# Patient Record
Sex: Female | Born: 1965 | Race: White | Hispanic: No | Marital: Married | Smoking: Never smoker
Health system: Southern US, Community
[De-identification: ages and names within clinical notes are randomized; demographics above are authoritative.]

## PROBLEM LIST (undated history)

## (undated) DIAGNOSIS — K219 Gastro-esophageal reflux disease without esophagitis: Secondary | ICD-10-CM

## (undated) DIAGNOSIS — F419 Anxiety disorder, unspecified: Secondary | ICD-10-CM

## (undated) DIAGNOSIS — F32A Depression, unspecified: Secondary | ICD-10-CM

## (undated) DIAGNOSIS — R51 Headache: Secondary | ICD-10-CM

## (undated) DIAGNOSIS — N320 Bladder-neck obstruction: Secondary | ICD-10-CM

## (undated) DIAGNOSIS — F329 Major depressive disorder, single episode, unspecified: Secondary | ICD-10-CM

## (undated) HISTORY — PX: OTHER SURGICAL HISTORY: SHX169

## (undated) HISTORY — PX: TUBAL LIGATION: SHX77

## (undated) HISTORY — PX: BLADDER NECK RECONSTRUCTION: SHX1239

## (undated) HISTORY — PX: COLONOSCOPY: SHX174

---

## 2010-10-24 ENCOUNTER — Other Ambulatory Visit (HOSPITAL_COMMUNITY): Payer: Self-pay | Admitting: Urology

## 2010-10-24 DIAGNOSIS — R338 Other retention of urine: Secondary | ICD-10-CM

## 2010-10-25 ENCOUNTER — Ambulatory Visit (HOSPITAL_COMMUNITY)
Admission: RE | Admit: 2010-10-25 | Discharge: 2010-10-25 | Disposition: A | Discharge status: Home / Self Care | Payer: BC Managed Care – PPO | Source: Ambulatory Visit | Attending: Urology | Admitting: Urology

## 2010-10-25 DIAGNOSIS — R338 Other retention of urine: Secondary | ICD-10-CM | POA: Insufficient documentation

## 2010-10-25 DIAGNOSIS — N83209 Unspecified ovarian cyst, unspecified side: Secondary | ICD-10-CM | POA: Insufficient documentation

## 2010-10-25 DIAGNOSIS — R109 Unspecified abdominal pain: Secondary | ICD-10-CM | POA: Insufficient documentation

## 2010-11-08 ENCOUNTER — Other Ambulatory Visit: Payer: Self-pay | Admitting: Anesthesiology

## 2010-11-08 ENCOUNTER — Encounter (HOSPITAL_COMMUNITY): Payer: BC Managed Care – PPO

## 2010-11-08 ENCOUNTER — Other Ambulatory Visit: Payer: Self-pay | Admitting: Infectious Diseases

## 2010-11-08 LAB — BASIC METABOLIC PANEL
CO2: 26 mEq/L (ref 19–32)
Chloride: 104 mEq/L (ref 96–112)
GFR calc Af Amer: >60 mL/min (ref >60)
Potassium: 3.9 mEq/L (ref 3.5–5.1)
Sodium: 139 mEq/L (ref 135–145)

## 2010-11-08 LAB — SURGICAL PCR SCREEN: MRSA, PCR: NEGATIVE

## 2010-11-12 ENCOUNTER — Ambulatory Visit (HOSPITAL_COMMUNITY)
Admission: RE | Admit: 2010-11-12 | Discharge: 2010-11-12 | Disposition: A | Discharge status: Home / Self Care | Payer: BC Managed Care – PPO | Source: Ambulatory Visit | Attending: Urology | Admitting: Urology

## 2010-11-12 DIAGNOSIS — N35919 Unspecified urethral stricture, male, unspecified site: Secondary | ICD-10-CM | POA: Insufficient documentation

## 2010-11-21 NOTE — H&P (Signed)
  NAMECHARMAN, BLASCO               ACCOUNT NO.:  [id number]  MEDICAL RECORD NO.:  [id number]           PATIENT TYPE:  LOCATION:                                 FACILITY:  PHYSICIAN:  Ky Barban, M.D.DATE OF BIRTH:  Nov 11, 1965  DATE OF ADMISSION: DATE OF DISCHARGE:  LH                             HISTORY & PHYSICAL   CHIEF COMPLAINT:  Had difficulty to void.  HISTORY:  A 45 year old female was in acute urinary retention couple of weeks ago.  Subsequently, she underwent cystoscopy and cystometric in the office.  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  I dilated her urethra and she is voiding but not still having difficulty to void lot of hesitancy so I have recommended that we go ahead and do her dilation under anesthesia as an outpatient.  She is coming as an outpatient, will undergo cystoscopy and dilation under anesthesia.  PAST MEDICAL HISTORY:  No significant medical problem.  She does suffer from depression and takes to have also a and  PERSONAL HISTORY:  SOCIAL HISTORY:  She does not smoke or drink, and she is married.  REVIEW OF SYSTEMS:  Unremarkable.  PHYSICAL EXAMINATION:  VITAL SIGNS:  On examination, her blood pressure 130/80, temperature is normal. CENTRAL NERVOUS SYSTEM: No gross neurological deficit. HEAD, NECK, ENT:  Negative. CHEST:  Symmetrical. HEART:  Regular sinus rhythm.  No murmur. ABDOMEN:  Soft, flat.  Liver, spleen, kidneys are not palpable.  No CVA tenderness. PELVIC:  No adnexal mass or tenderness.  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  When she was here in the office on Nov 09, 2010, her residual urine was only 21 mL which is not bad so I told that we will dilate her urethra to see if it helps her symptomatically.     Ky Barban, M.D.     MIJ/MEDQ  D:  11/07/2010  T:  11/08/2010  Job:   [id number]  Electronically Signed by Alleen Borne M.D. on 11/21/2010 03:09:27 PM

## 2010-11-21 NOTE — Op Note (Signed)
  [name], [name]               ACCOUNT NO.:  [id number]  MEDICAL RECORD NO.:  [id number]           PATIENT TYPE:  O  LOCATION:  [facility]YP                          FACILITY:  [facility]  PHYSICIAN:  Ky Barban, M.D.DATE OF BIRTH:  08-Jun-1966  DATE OF PROCEDURE:  11/12/2010 DATE OF DISCHARGE:                              OPERATIVE REPORT   PREOPERATIVE DIAGNOSIS:  Difficulty to void.  POSTOPERATIVE DIAGNOSIS:  Urethral stenosis.  PROCEDURE:  Cystoscopy with dilation.  SURGEON:  Ky Barban, MD  ANESTHESIA:  MAC.  PROCEDURE IN DETAIL:  The patient under IV MAC in lithotomy position after usual prep and drape, a #25 cystoscope was introduced into the bladder.  It was inspected thoroughly.  No tumor stone, foreign bodies, or inflammation.  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  Urethra also looks of normal.  It calibrates to 25-French.  Cystoscope was removed.  Urethra was dilated gradually to 32-French.  Then at the end, I introduced two sounds together, size 22 and 18 to dilate her further with no complications. Cystoscope was removed.  Bladder was emptied.  The patient left the operating room in satisfactory condition.  I did a bimanual exam and also pelvic exam and it was unremarkable.  The patient left the operating room in satisfactory condition.     Ky Barban, M.D.     MIJ/MEDQ  D:  11/12/2010  T:  11/12/2010  Job:  [id number]  Electronically Signed by Alleen Borne M.D. on 11/21/2010 03:09:30 PM

## 2011-01-31 ENCOUNTER — Encounter (HOSPITAL_COMMUNITY): Payer: Self-pay

## 2011-01-31 ENCOUNTER — Encounter (HOSPITAL_COMMUNITY)
Admission: RE | Admit: 2011-01-31 | Discharge: 2011-01-31 | Disposition: A | Discharge status: Home / Self Care | Payer: BC Managed Care – PPO | Source: Ambulatory Visit | Attending: Obstetrics and Gynecology | Admitting: Obstetrics and Gynecology

## 2011-01-31 HISTORY — DX: Major depressive disorder, single episode, unspecified: F32.9

## 2011-01-31 HISTORY — DX: Gastro-esophageal reflux disease without esophagitis: K21.9

## 2011-01-31 HISTORY — DX: Depression, unspecified: F32.A

## 2011-01-31 HISTORY — DX: Headache: R51

## 2011-01-31 HISTORY — DX: Bladder-neck obstruction: N32.0

## 2011-01-31 HISTORY — DX: Anxiety disorder, unspecified: F41.9

## 2011-01-31 LAB — BASIC METABOLIC PANEL
CO2: 28 mEq/L (ref 19–32)
Chloride: 99 mEq/L (ref 96–112)
GFR calc non Af Amer: >60 mL/min (ref >60)
Glucose, Bld: 92 mg/dL (ref 70–99)
Potassium: 4.1 mEq/L (ref 3.5–5.1)
Sodium: 136 mEq/L (ref 135–145)

## 2011-01-31 LAB — URINALYSIS, ROUTINE W REFLEX MICROSCOPIC
Glucose, UA: NEGATIVE mg/dL
Ketones, ur: 15 mg/dL — AB
pH: 6 (ref 5.0–8.0)

## 2011-01-31 LAB — CBC
HCT: 39.2 % (ref 36.0–46.0)
Hemoglobin: 12.7 g/dL (ref 12.0–15.0)
MCH: 25.7 pg — ABNORMAL LOW (ref 26.0–34.0)
MCV: 79.4 fL (ref 78.0–100.0)
RBC: 4.94 MIL/uL (ref 3.87–5.11)

## 2011-01-31 LAB — URINE MICROSCOPIC-ADD ON

## 2011-01-31 NOTE — Patient Instructions (Addendum)
20 [name]  01/31/2011   Your procedure is scheduled on:  02/11/11  Report to [facility] at 1030 AM.desk phone- [phone]  Call this number if you have problems the morning of surgery: [phone]   Remember:   Do not eat food:After Midnight.  Do not drink clear liquids: 4 Hours before arrival.until 8am  Take these medicines the morning of surgery with A SIP OF WATER: Dexilant   Do not wear jewelry, make-up or nail polish.  Do not wear lotions, powders, or perfumes. You may wear deodorant.  Do not shave 48 hours prior to surgery.  Do not bring valuables to the hospital.  Contacts, dentures or bridgework may not be worn into surgery.  Leave suitcase in the car. After surgery it may be brought to your room.  For patients admitted to the hospital, checkout time is 11:00 AM the day of discharge.   Patients discharged the day of surgery will not be allowed to drive home.  Name and phone number of your driver: Fayrene Fearing- [phone]  Special Instructions: CHG Shower Use Special Wash: 1/2 bottle night before surgery and 1/2 bottle morning of surgery.   Please read over the following fact sheets that you were given: Care and Recovery After Surgery

## 2011-02-10 MED ORDER — CEFOTETAN DISODIUM 2 G IJ SOLR
2.0000 g | INTRAMUSCULAR | Status: AC
  Administered 2011-02-11: 2 g via INTRAVENOUS
  Filled 2011-02-10: qty 2

## 2011-02-11 ENCOUNTER — Encounter (HOSPITAL_COMMUNITY): Payer: Self-pay | Admitting: *Deleted

## 2011-02-11 ENCOUNTER — Encounter (HOSPITAL_COMMUNITY): Payer: Self-pay

## 2011-02-11 ENCOUNTER — Encounter (HOSPITAL_COMMUNITY)
Admission: RE | Disposition: A | Discharge status: Home / Self Care | Payer: Self-pay | Source: Ambulatory Visit | Attending: Obstetrics and Gynecology

## 2011-02-11 ENCOUNTER — Other Ambulatory Visit: Payer: Self-pay | Admitting: Obstetrics and Gynecology

## 2011-02-11 ENCOUNTER — Ambulatory Visit (HOSPITAL_COMMUNITY)
Admission: RE | Admit: 2011-02-11 | Discharge: 2011-02-13 | Disposition: A | Discharge status: Home / Self Care | Payer: BC Managed Care – PPO | Source: Ambulatory Visit | Attending: Obstetrics and Gynecology | Admitting: Obstetrics and Gynecology

## 2011-02-11 ENCOUNTER — Ambulatory Visit (HOSPITAL_COMMUNITY): Payer: BC Managed Care – PPO

## 2011-02-11 DIAGNOSIS — N8000 Endometriosis of the uterus, unspecified: Secondary | ICD-10-CM | POA: Insufficient documentation

## 2011-02-11 DIAGNOSIS — Z01812 Encounter for preprocedural laboratory examination: Secondary | ICD-10-CM | POA: Insufficient documentation

## 2011-02-11 DIAGNOSIS — Z01818 Encounter for other preprocedural examination: Secondary | ICD-10-CM | POA: Insufficient documentation

## 2011-02-11 DIAGNOSIS — Z90710 Acquired absence of both cervix and uterus: Secondary | ICD-10-CM

## 2011-02-11 DIAGNOSIS — N393 Stress incontinence (female) (male): Secondary | ICD-10-CM | POA: Insufficient documentation

## 2011-02-11 DIAGNOSIS — N920 Excessive and frequent menstruation with regular cycle: Secondary | ICD-10-CM | POA: Insufficient documentation

## 2011-02-11 HISTORY — PX: BLADDER SUSPENSION: SHX72

## 2011-02-11 HISTORY — PX: CYSTOSCOPY: SHX5120

## 2011-02-11 HISTORY — PX: VAGINAL HYSTERECTOMY: SHX2639

## 2011-02-11 LAB — PREGNANCY, URINE: Preg Test, Ur: NEGATIVE

## 2011-02-11 SURGERY — HYSTERECTOMY, VAGINAL
Anesthesia: General | Site: Bladder

## 2011-02-11 MED ORDER — ONDANSETRON HCL 4 MG/2ML IJ SOLN
INTRAMUSCULAR | Status: AC
  Filled 2011-02-11: qty 2

## 2011-02-11 MED ORDER — GLYCOPYRROLATE 0.2 MG/ML IJ SOLN
INTRAMUSCULAR | Status: AC
  Filled 2011-02-11: qty 3

## 2011-02-11 MED ORDER — SODIUM CHLORIDE 0.9 % IJ SOLN: 9.0000 mL | INTRAMUSCULAR | Status: DC | PRN

## 2011-02-11 MED ORDER — DEXAMETHASONE SODIUM PHOSPHATE 4 MG/ML IJ SOLN
INTRAMUSCULAR | Status: DC | PRN
  Administered 2011-02-11: 10 mg via INTRAVENOUS

## 2011-02-11 MED ORDER — ONDANSETRON HCL 4 MG/2ML IJ SOLN
INTRAMUSCULAR | Status: DC | PRN
  Administered 2011-02-11: 4 mg via INTRAVENOUS

## 2011-02-11 MED ORDER — DEXAMETHASONE SODIUM PHOSPHATE 10 MG/ML IJ SOLN
INTRAMUSCULAR | Status: AC
  Filled 2011-02-11: qty 1

## 2011-02-11 MED ORDER — NEOSTIGMINE METHYLSULFATE 1 MG/ML IJ SOLN
INTRAMUSCULAR | Status: DC | PRN
  Administered 2011-02-11: 2.5 mg via INTRAVENOUS

## 2011-02-11 MED ORDER — LIDOCAINE HCL (CARDIAC) 20 MG/ML IV SOLN
INTRAVENOUS | Status: AC
  Filled 2011-02-11: qty 5

## 2011-02-11 MED ORDER — INDIGOTINDISULFONATE SODIUM 8 MG/ML IJ SOLN
INTRAMUSCULAR | Status: DC | PRN
  Administered 2011-02-11: 5 mL via INTRAVENOUS

## 2011-02-11 MED ORDER — FENTANYL CITRATE 0.05 MG/ML IJ SOLN
INTRAMUSCULAR | Status: AC
  Filled 2011-02-11: qty 5

## 2011-02-11 MED ORDER — MENTHOL 3 MG MT LOZG: 1.0000 | LOZENGE | OROMUCOSAL | Status: DC | PRN

## 2011-02-11 MED ORDER — FENTANYL CITRATE 0.05 MG/ML IJ SOLN
25.0000 ug | INTRAMUSCULAR | Status: DC | PRN
  Administered 2011-02-11: 25 ug via INTRAVENOUS

## 2011-02-11 MED ORDER — ONDANSETRON HCL 4 MG/2ML IJ SOLN
4.0000 mg | Freq: Four times a day (QID) | INTRAMUSCULAR | Status: DC | PRN
  Administered 2011-02-12: 4 mg via INTRAVENOUS
  Filled 2011-02-11: qty 2

## 2011-02-11 MED ORDER — LACTATED RINGERS IV SOLN
INTRAVENOUS | Status: DC
  Administered 2011-02-11 (×2): via INTRAVENOUS
  Administered 2011-02-11: 1000 mL via INTRAVENOUS

## 2011-02-11 MED ORDER — MORPHINE SULFATE (PF) 1 MG/ML IV SOLN
INTRAVENOUS | Status: DC
  Administered 2011-02-11 – 2011-02-12 (×3): 25 mg via INTRAVENOUS
  Administered 2011-02-12: 39 mg via INTRAVENOUS
  Administered 2011-02-12: 10 mg via INTRAVENOUS
  Filled 2011-02-11 (×4): qty 25

## 2011-02-11 MED ORDER — HYDROMORPHONE HCL 1 MG/ML IJ SOLN
INTRAMUSCULAR | Status: DC | PRN
  Administered 2011-02-11: 1 mg via INTRAVENOUS

## 2011-02-11 MED ORDER — PROPOFOL 10 MG/ML IV EMUL
INTRAVENOUS | Status: AC
  Filled 2011-02-11: qty 20

## 2011-02-11 MED ORDER — LIDOCAINE HCL (CARDIAC) 20 MG/ML IV SOLN
INTRAVENOUS | Status: DC | PRN
  Administered 2011-02-11: 20 mg via INTRAVENOUS

## 2011-02-11 MED ORDER — BUPROPION HCL ER (SR) 150 MG PO TB12
150.0000 mg | ORAL_TABLET | Freq: Two times a day (BID) | ORAL | Status: DC
  Administered 2011-02-11 – 2011-02-13 (×4): 150 mg via ORAL
  Filled 2011-02-11 (×6): qty 1

## 2011-02-11 MED ORDER — IBUPROFEN 600 MG PO TABS
600.0000 mg | ORAL_TABLET | Freq: Four times a day (QID) | ORAL | Status: DC | PRN
  Administered 2011-02-13: 600 mg via ORAL
  Filled 2011-02-11: qty 1

## 2011-02-11 MED ORDER — STERILE WATER FOR IRRIGATION IR SOLN
Status: DC | PRN
  Administered 2011-02-11: 1000 mL

## 2011-02-11 MED ORDER — MEPERIDINE HCL 25 MG/ML IJ SOLN: 6.2500 mg | INTRAMUSCULAR | Status: DC | PRN

## 2011-02-11 MED ORDER — PANTOPRAZOLE SODIUM 40 MG PO TBEC
40.0000 mg | DELAYED_RELEASE_TABLET | Freq: Every day | ORAL | Status: DC
  Administered 2011-02-12: 40 mg via ORAL
  Filled 2011-02-11 (×4): qty 1

## 2011-02-11 MED ORDER — FENTANYL CITRATE 0.05 MG/ML IJ SOLN
INTRAMUSCULAR | Status: DC | PRN
  Administered 2011-02-11 (×3): 50 ug via INTRAVENOUS
  Administered 2011-02-11: 100 ug via INTRAVENOUS
  Administered 2011-02-11 (×3): 50 ug via INTRAVENOUS

## 2011-02-11 MED ORDER — SUCCINYLCHOLINE CHLORIDE 20 MG/ML IJ SOLN
INTRAMUSCULAR | Status: DC | PRN
  Administered 2011-02-11: 60 mg via INTRAVENOUS

## 2011-02-11 MED ORDER — KETOROLAC TROMETHAMINE 30 MG/ML IJ SOLN
INTRAMUSCULAR | Status: DC | PRN
  Administered 2011-02-11: 30 mg via INTRAVENOUS

## 2011-02-11 MED ORDER — ROCURONIUM BROMIDE 50 MG/5ML IV SOLN
INTRAVENOUS | Status: AC
  Filled 2011-02-11: qty 1

## 2011-02-11 MED ORDER — DIPHENHYDRAMINE HCL 50 MG/ML IJ SOLN: 12.5000 mg | Freq: Four times a day (QID) | INTRAMUSCULAR | Status: DC | PRN

## 2011-02-11 MED ORDER — NEOSTIGMINE METHYLSULFATE 1 MG/ML IJ SOLN
INTRAMUSCULAR | Status: AC
  Filled 2011-02-11: qty 10

## 2011-02-11 MED ORDER — FENTANYL CITRATE 0.05 MG/ML IJ SOLN
INTRAMUSCULAR | Status: AC
  Administered 2011-02-11: 25 ug via INTRAVENOUS
  Filled 2011-02-11: qty 2

## 2011-02-11 MED ORDER — METOCLOPRAMIDE HCL 5 MG/ML IJ SOLN: 10.0000 mg | Freq: Once | INTRAMUSCULAR | Status: DC | PRN

## 2011-02-11 MED ORDER — SIMETHICONE 80 MG PO CHEW
80.0000 mg | CHEWABLE_TABLET | Freq: Four times a day (QID) | ORAL | Status: DC | PRN
  Administered 2011-02-12: 80 mg via ORAL

## 2011-02-11 MED ORDER — MIDAZOLAM HCL 5 MG/5ML IJ SOLN
INTRAMUSCULAR | Status: DC | PRN
  Administered 2011-02-11: 2 mg via INTRAVENOUS

## 2011-02-11 MED ORDER — DIPHENHYDRAMINE HCL 12.5 MG/5ML PO ELIX: 12.5000 mg | ORAL_SOLUTION | Freq: Four times a day (QID) | ORAL | Status: DC | PRN

## 2011-02-11 MED ORDER — PROPOFOL 10 MG/ML IV EMUL
INTRAVENOUS | Status: DC | PRN
  Administered 2011-02-11: 200 mg via INTRAVENOUS

## 2011-02-11 MED ORDER — DOCUSATE SODIUM 100 MG PO CAPS
100.0000 mg | ORAL_CAPSULE | Freq: Every day | ORAL | Status: DC
  Administered 2011-02-12 – 2011-02-13 (×2): 100 mg via ORAL
  Filled 2011-02-11 (×2): qty 1

## 2011-02-11 MED ORDER — KETOROLAC TROMETHAMINE 30 MG/ML IJ SOLN
INTRAMUSCULAR | Status: AC
  Filled 2011-02-11: qty 1

## 2011-02-11 MED ORDER — OXYCODONE-ACETAMINOPHEN 5-325 MG PO TABS
1.0000 | ORAL_TABLET | ORAL | Status: DC | PRN
  Administered 2011-02-12: 2 via ORAL
  Administered 2011-02-12: 1 via ORAL
  Administered 2011-02-12: 2 via ORAL
  Administered 2011-02-12: 1 via ORAL
  Administered 2011-02-13: 2 via ORAL
  Administered 2011-02-13: 1 via ORAL
  Administered 2011-02-13: 2 via ORAL
  Administered 2011-02-13: 1 via ORAL
  Filled 2011-02-11 (×2): qty 2
  Filled 2011-02-11 (×3): qty 1
  Filled 2011-02-11: qty 2
  Filled 2011-02-11: qty 1
  Filled 2011-02-11: qty 2

## 2011-02-11 MED ORDER — ROCURONIUM BROMIDE 100 MG/10ML IV SOLN
INTRAVENOUS | Status: DC | PRN
  Administered 2011-02-11: 40 mg via INTRAVENOUS
  Administered 2011-02-11: 10 mg via INTRAVENOUS

## 2011-02-11 MED ORDER — MIDAZOLAM HCL 2 MG/2ML IJ SOLN
INTRAMUSCULAR | Status: AC
  Filled 2011-02-11: qty 2

## 2011-02-11 MED ORDER — NALOXONE HCL 0.4 MG/ML IJ SOLN: 0.4000 mg | INTRAMUSCULAR | Status: DC | PRN

## 2011-02-11 MED ORDER — TEMAZEPAM 15 MG PO CAPS: 15.0000 mg | ORAL_CAPSULE | Freq: Every evening | ORAL | Status: DC | PRN

## 2011-02-11 MED ORDER — HYDROMORPHONE HCL 1 MG/ML IJ SOLN
INTRAMUSCULAR | Status: AC
  Filled 2011-02-11: qty 1

## 2011-02-11 MED ORDER — GLYCOPYRROLATE 0.2 MG/ML IJ SOLN
INTRAMUSCULAR | Status: DC | PRN
  Administered 2011-02-11: 0.2 mg via INTRAVENOUS
  Administered 2011-02-11: .5 mg via INTRAVENOUS

## 2011-02-11 MED ORDER — LIDOCAINE-EPINEPHRINE 1 %-1:100000 IJ SOLN
INTRAMUSCULAR | Status: DC | PRN
  Administered 2011-02-11: 10 mL via INTRADERMAL

## 2011-02-11 MED ORDER — SUCCINYLCHOLINE CHLORIDE 20 MG/ML IJ SOLN
INTRAMUSCULAR | Status: AC
  Filled 2011-02-11: qty 1

## 2011-02-11 MED ORDER — LACTATED RINGERS IV SOLN
INTRAVENOUS | Status: DC
  Administered 2011-02-11 – 2011-02-12 (×4): via INTRAVENOUS

## 2011-02-11 SURGICAL SUPPLY — 42 items
BLADE SURG 11 STRL SS (BLADE) ×3 IMPLANT
CANISTER SUCTION 2500CC (MISCELLANEOUS) ×3 IMPLANT
CATH FOLEY 2WAY SLVR  5CC 18FR (CATHETERS) ×1
CATH FOLEY 2WAY SLVR 5CC 18FR (CATHETERS) ×2 IMPLANT
CLOTH BEACON ORANGE TIMEOUT ST (SAFETY) ×3 IMPLANT
CONT PATH 16OZ SNAP LID 3702 (MISCELLANEOUS) IMPLANT
CONTAINER PREFILL 10% NBF 60ML (FORM) IMPLANT
DECANTER SPIKE VIAL GLASS SM (MISCELLANEOUS) ×3 IMPLANT
DERMABOND ADVANCED (GAUZE/BANDAGES/DRESSINGS) ×3 IMPLANT
DRAPE CAMERA CLOSED 9X96 (DRAPES) ×3 IMPLANT
DRAPE HYSTEROSCOPY (DRAPE) ×3 IMPLANT
DRAPE UTILITY XL STRL (DRAPES) ×3 IMPLANT
GAUZE PACKING 2X5 YD STERILE (GAUZE/BANDAGES/DRESSINGS) ×3 IMPLANT
GAUZE PACKING IODOFORM 2 (PACKING) ×3 IMPLANT
GLOVE BIO SURGEON STRL SZ 6.5 (GLOVE) ×6 IMPLANT
GLOVE BIO SURGEON STRL SZ8 (GLOVE) IMPLANT
GLOVE BIOGEL PI IND STRL 6.5 (GLOVE) ×2 IMPLANT
GLOVE BIOGEL PI INDICATOR 6.5 (GLOVE) ×1
GOWN PREVENTION PLUS LG XLONG (DISPOSABLE) ×12 IMPLANT
NEEDLE HYPO 22GX1.5 SAFETY (NEEDLE) ×3 IMPLANT
NEEDLE SPNL 22GX3.5 QUINCKE BK (NEEDLE) IMPLANT
NS IRRIG 1000ML POUR BTL (IV SOLUTION) ×3 IMPLANT
PACK VAGINAL WOMENS (CUSTOM PROCEDURE TRAY) ×3 IMPLANT
PLUG CATH AND CAP STER (CATHETERS) ×3 IMPLANT
SET CYSTO W/LG BORE CLAMP LF (SET/KITS/TRAYS/PACK) ×3 IMPLANT
SLING TVT EXACT (Sling) ×3 IMPLANT
SPONGE SURGIFOAM ABS GEL 12-7 (HEMOSTASIS) ×3 IMPLANT
SURGIFLO TRUKIT (HEMOSTASIS) IMPLANT
SUT VIC AB 0 CT1 18XCR BRD8 (SUTURE) ×6 IMPLANT
SUT VIC AB 0 CT1 27 (SUTURE) ×4
SUT VIC AB 0 CT1 27XBRD ANBCTR (SUTURE) ×8 IMPLANT
SUT VIC AB 0 CT1 8-18 (SUTURE) ×3
SUT VIC AB 2-0 CT2 27 (SUTURE) IMPLANT
SUT VIC AB 2-0 SH 27 (SUTURE) ×2
SUT VIC AB 2-0 SH 27XBRD (SUTURE) ×4 IMPLANT
SUT VIC AB 2-0 UR5 27 (SUTURE) IMPLANT
SUT VICRYL 0 TIES 12 18 (SUTURE) ×3 IMPLANT
SYRINGE 10CC LL (SYRINGE) ×3 IMPLANT
TOWEL OR 17X24 6PK STRL BLUE (TOWEL DISPOSABLE) ×6 IMPLANT
TRAY FOLEY CATH 16FR SILVER (SET/KITS/TRAYS/PACK) ×3 IMPLANT
TUBING CONNECTING 10 (TUBING) ×3 IMPLANT
WATER STERILE IRR 1000ML POUR (IV SOLUTION) ×3 IMPLANT

## 2011-02-11 NOTE — Anesthesia Procedure Notes (Addendum)
Procedure Name: Intubation Date/Time: 02/11/2011 1:36 PM Performed by: Lincoln Brigham Pre-anesthesia Checklist: Patient identified, Patient being monitored, Emergency Drugs available, Timeout performed and Suction available Patient Re-evaluated:Patient Re-evaluated prior to inductionOxygen Delivery Method: Circle System Utilized Preoxygenation: Pre-oxygenation with 100% oxygen Intubation Type: IV induction and Rapid sequence Laryngoscope Size: Miller and 2 Grade View: Grade II Tube type: Oral Tube size: 7.0 mm Number of attempts: 1 Airway Equipment and Method: stylet Placement Confirmation: ETT inserted through vocal cords under direct vision,  positive ETCO2 and breath sounds checked- equal and bilateral Secured at: 22 cm Tube secured with: Tape Dental Injury: Teeth and Oropharynx as per pre-operative assessment

## 2011-02-11 NOTE — Op Note (Signed)
[name], [name]               ACCOUNT NO.:  [id number]  MEDICAL RECORD NO.:  [id number]  LOCATION:  9304                          FACILITY:  [facility]  PHYSICIAN:  Randye Lobo, M.D.   DATE OF BIRTH:  July 01, 1965  DATE OF PROCEDURE:  02/11/2011 DATE OF DISCHARGE:                              OPERATIVE REPORT   PREOPERATIVE DIAGNOSES: 1. Menometrorrhagia. 2. Genuine stress incontinence.  POSTOPERATIVE DIAGNOSES: 1. Menometrorrhagia. 2. Genuine stress incontinence.  PROCEDURE:  Total vaginal hysterectomy, TVT Exact mid urethral sling, and cystoscopy.  SURGEON:  Conley Simmonds, MD  ASSISTANT:  Lodema Hong, MD  ANESTHESIA:  General endotracheal, local with 1% lidocaine with epinephrine 1:100,000.  IV FLUIDS:  2500 mL Ringer lactate.  ESTIMATED BLOOD LOSS:  100 mL.  URINE OUTPUT:  250 mL.  COMPLICATIONS:  None.  INDICATIONS FOR PROCEDURE:  The patient is a 45 year old gravida 4, para 2-0-0-2 Caucasian female, status post bilateral tubal ligation who presented with heavy irregular menstrual bleeding and urinary incontinence with stressful maneuvers.  Office ultrasound documented an endometrial stripe of 23 mm and was otherwise unremarkable.  Endometrial biopsy was benign.  The patient has had urinary incontinence which temporarily was improved with the use of Cymbalta for treatment of depression.  However, the patient actually developed urinary retention and therefore underwent 2 urethral dilations in 2012.  She reports that the urinary retention has resolved since stopping the Cymbalta and now her urinary incontinence with coughing and sneezing has worsened.  She has had urodynamic studies performed at an outside institution.  The patient desires hysterectomy and surgical treatment for her urinary incontinence and plan is therefore made to proceed with a total vaginal hysterectomy with TVT Exact mid urethral sling and cystoscopy after risks, benefits, and alternatives are  reviewed.  FINDINGS:  Exam under anesthesia revealed minimal vaginal descensus. The anterior and posterior walls similarly demonstrated minimal descensus.  The uterus appeared to be approximately 6 weeks' size, and the cervix was patulous.  The fallopian tubes and ovaries could not be visualized, but they palpated to be normal.  Cystoscopy during placement of the sling documented the absence of a foreign body in the bladder and the urethra.  The bladder was visualized throughout 360 degrees including the bladder dome and trigone.  The ureters were patent bilaterally after the injection of indigo carmine dye IV.  SPECIMEN:  The uterus and cervix were sent to Pathology.  PROCEDURE:  The patient was reidentified in the preoperative hold area. She received cefotetan for IV antibiotic prophylaxis, and she received TED hose and PAS stockings for DVT prophylaxis.  In the operating room, general endotracheal anesthesia was induced, and the patient was placed in the dorsal lithotomy position.  The lower abdomen, vagina, and perineum were then sterilely prepped and draped.  A Foley catheter was sterilely placed inside the bladder.  A weighted speculum was placed inside the vagina.  Tenaculums were placed on the anterior and posterior cervical lips.  The cervix was then injected circumferentially with 1% lidocaine with epinephrine 1:100,000. The cervix was then circumscribed with a scalpel.  The posterior cul-de- sac was entered sharply with a Mayo scissors.  Digital exam confirmed proper entry  into the posterior cul-de-sac.  A long weighted speculum was placed inside the posterior cul-de-sac. Each of the uterosacral ligaments were then clamped, sharply divided, and suture ligated with transfixing sutures of 0 Vicryl.  The bladder was dissected off of the cervix anteriorly using sharp dissection.  Each of the bladder pillars were then clamped, sharply divided, and suture ligated with 0  Vicryl.  The anterior cul-de-sac was entered sharply at this time and again digital examination confirmed proper entry into this location.  A Deaver was then placed in the anterior cul-de-sac.  The cardinal ligaments were then clamped, sharply divided, and suture ligated with 0 Vicryl bilaterally.  The inferior aspects of the broad ligaments were then clamped, sharply divided, again suture-ligated with 0 Vicryl bilaterally.  Eventually, the pedicles reached the round ligaments which were clamped, sharply divided, and suture ligated with 0 Vicryl.  The proximal fallopian tubes and utero-ovarian ligaments were then clamped, sharply divided, and the specimen was freed and sent to Pathology.  Each of these upper pedicles were tied with a free tie of 0 Vicryl followed by a suture ligature of the same.  These pedicles were then held.  All the pedicles were examined.  There was some bleeding noted just below the utero-ovarian ligament and the fallopian tube on the patient's right-hand side.  This was grasped with a right angle and was suture ligated with a transfixing suture of 0 Vicryl to produce good hemostasis.  On each side just above the uterosacral ligaments.  The pedicle was bleeding slightly and two figure-of-eight sutures of 0 Vicryl were placed again which created good hemostasis.  The posterior vaginal mucosa was then sutured with a running locked suture of 0 Vicryl.  The entire vaginal cuff was closed at this time by placing a running locked suture of 0 Vicryl.  Hemostasis was good.  The sling was performed next.  Allis clamps were placed along the anterior vaginal wall in the midline beginning 1 cm below the urethral meatus.  These were placed over a distance of 4 cm.  The mucosa was injected locally with 1% lidocaine with epinephrine 1:100,000.  The mucosa was then incised vertically in the midline with a scalpel.  A combination of sharp and blunt dissection were used to  dissect the subvaginal tissue and bladder off of the overlying vaginal mucosa.  Some of the fascia over the bladder just lateral to the urethra and the patient's left-hand side appeared denuded and a figure-of-eight suture of 2-0 Vicryl was used to reapproximate the fascia.  A 1-cm suprapubic incisions were then created 2 cm to the right and left of the midline.  The TVT Exact was placed in a bottom up fashion.  The Foley catheter was removed and the urethral Foley.  Obturator was then placed in the urethra and deflected properly.  The sling needle passer was placed first through the endopelvic fascia on the patient's right-hand side lateral to the level of the mid urethra.  It was brought through the space of Retzius and then out through the right suprapubic incision. The urethra was then deflected in the other direction and the other remaining arm of the sling was brought up through the left suprapubic incision.  Cystoscopy was performed at this time and the findings are as noted above.  The sling was brought through the suprapubic incisions, and a Kelly clamp was placed between the sling and the urethra as the plastic sheaths were removed.  The excess sling was trimmed suprapubically.  There was a bleeding vein at the bed of the dissection on the patient's right-hand side vaginally and this responded to a figure-of-eight suture of 2-0 Vicryl.  Excess vaginal sling was trimmed and Gelfoam was then placed near the exit sites of the sling vaginally, bilaterally.  The anterior vaginal mucosa was then closed with a running locked suture of 2-0 Vicryl.  A vaginal packing with Estrace cream was placed inside the vagina.  The suprapubic incisions were closed with Dermabond.  The Foley catheter was left to gravity drainage.  This concluded the patient's procedure.  There were no complications. All needle, instrument, sponge counts were correct.     Randye Lobo,  M.D.     BES/MEDQ  D:  02/11/2011  T:  02/11/2011  Job:  [id number]

## 2011-02-11 NOTE — Brief Op Note (Signed)
02/11/2011  4:11 PM  PATIENT:  [name]  45 y.o. female  PRE-OPERATIVE DIAGNOSIS:  menometrorrhagia;stress urinary incontinence  POST-OPERATIVE DIAGNOSIS:  menometrorrhagia;stress urinary incontinence  PROCEDURE:  Procedure(s): HYSTERECTOMY VAGINAL TRANSVAGINAL TAPE (TVT) PROCEDURE CYSTOSCOPY  SURGEON:  Surgeon(s): Kollyn Lingafelter A Silva W Scott Bowie  PHYSICIAN ASSISTANT:   ASSISTANTS: Luvenia Redden  ANESTHESIA:   general  ESTIMATED BLOOD LOSS: * No blood loss amount entered *   BLOOD ADMINISTERED:none  DRAINS: none and Urinary Catheter (Foley)   LOCAL MEDICATIONS USED:  LIDOCAINE   SPECIMEN:  Source of Specimen:  uterus and cervix  DISPOSITION OF SPECIMEN:  PATHOLOGY  COUNTS:  YES  TOURNIQUET:  * No tourniquets in log *  DICTATION #: PLAN OF CARE: Admit post op.  PATIENT DISPOSITION:  PACU - hemodynamically stable.   Delay start of Pharmacological VTE agent (>24hrs) due to surgical blood loss or risk of bleeding:  no

## 2011-02-11 NOTE — H&P (Signed)
NAMEANGELINA, VENARD               ACCOUNT NO.:  [id number]  MEDICAL RECORD NO.:  [id number]  LOCATION:                                 FACILITY:  PHYSICIAN:  Randye Lobo, M.D.   DATE OF BIRTH:  1966-04-13  DATE OF ADMISSION: DATE OF DISCHARGE:                             HISTORY & PHYSICAL   CHIEF COMPLAINT:  Heavy and irregular menstrual bleeding and urinary incontinence.  HISTORY OF PRESENT ILLNESS:  The patient is a 45 year old gravida 4, para 2-0-2-2 Caucasian female, status post bilateral tubal ligation, who presents with heavy and irregular menstrual bleeding and urinary incontinence.  The patient reports bleeding in between her cycles and also heavy flow during her menses, which causes her to bleed through pads.  The patient has recently stopped her birth control pills secondary to weight gain.  An office ultrasound on January 02, 2011, documented a uterus with an endometrial stripe of 23.18 cm.  There were no fibroids.  The ovaries were normal.  An endometrial biopsy on January 02, 2011, documented benign secretory endometrium.  The patient reports urinary incontinence with coughing and sneezing. She reports recent treatment with Cymbalta for depression.  This was followed by a weight gain and then urinary retention.  The patient has been status post urethral dilation x2 in 2012 secondary to urinary retention.  This was performed by Dr. Jerre Simon at [facility].  The patient carries a diagnosis of some urethral stenosis. She has had cystoscopy documenting a normal bladder.  She had a urinary tract ultrasound on Oct 25, 2010, which showed unremarkable kidneys and a normal bladder.  Cystometric studies were performed by Dr. Jerre Simon and the office, and the report is unavailable.  The patient reports that since discontinuing the Cymbalta, her voiding pattern is now normal. She reports that her urinary incontinence has worsened.  The patient is now requesting  hysterectomy and surgical treatment for urinary incontinence.  She declines future childbearing.  PAST GYNECOLOGIC HISTORY:  Status post spontaneous vaginal delivery x2. Status post missed abortion x2.  Status post bilateral tubal ligation in 1995.  Last Pap smear December 19, 2010, was within normal limits.  Last mammogram December 20, 2010, and was within normal limits.  PAST MEDICAL HISTORY: 1. Gastroesophageal reflux disease. 2. Depression. 3. Urinary attention.  PAST SURGICAL HISTORY:  Status post urethral dilation x3.  The first urethral dilation was 11 years ago due to recurrent urinary tract infections.  She is status post urethral dilation x2 in 2012 for urinary retention.  MEDICATIONS:  Wellbutrin, Paxil, and Dexilant.  ALLERGIES:  No known drug allergies.  SOCIAL HISTORY:  The patient has been married for 23 years.  She works as an Print production planner for [employer].  She denies the use of tobacco, alcohol, or illicit drugs.  FAMILY HISTORY:  Negative for breast, ovarian, uterine, or colon cancer.  Positive for renal failure in the patient's mother.  REVIEW OF SYSTEMS:  The patient reports an increase in lower extremity edema since stopping Yaz birth control pills.  PHYSICAL EXAMINATION:  VITAL SIGNS:  Height 5 feet 3 inches, weight 206 pounds, blood pressure 130/80. LUNGS:  Clear to auscultation bilaterally.  HEART:  S1 and S2 with a regular rate and rhythm. ABDOMEN:  Soft and nontender without evidence of hepatosplenomegaly or organomegaly. PELVIC:  Normal external genitalia and urethra.  The cervix and vagina demonstrate no lesions.  There is minimal uterine prolapse.  The anterior and posterior vaginal walls appear to be well supported.  The uterus is small and nontender.  There is no evidence of any adnexal masses nor tenderness.  IMPRESSION:  The patient is a 45 year old gravida 4, para 2-0-2-2 Caucasian female, status post bilateral tubal ligation  with menometrorrhagia and genuine stress incontinence.  PLAN:  The patient will undergo a total vaginal hysterectomy with a TVT and midurethral sling and cystoscopy at the [facility] on February 11, 2011.  Risks, benefits, and alternatives have been discussed with the patient who wishes to proceed.     Randye Lobo, M.D.     BES/MEDQ  D:  02/10/2011  T:  02/10/2011  Job:  [id number]

## 2011-02-11 NOTE — Anesthesia Postprocedure Evaluation (Signed)
  Anesthesia Post-op Note  Patient: [name]  Procedure(s) Performed:  HYSTERECTOMY VAGINAL; TRANSVAGINAL TAPE (TVT) PROCEDURE; CYSTOSCOPY  Patient is awake and responsive. Pain and nausea are reasonably well controlled. Vital signs are stable and clinically acceptable. Oxygen saturation is clinically acceptable. There are no apparent anesthetic complications at this time. Patient is ready for discharge.

## 2011-02-11 NOTE — Anesthesia Preprocedure Evaluation (Signed)
Anesthesia Evaluation  Name, MR# and DOB Patient awake  General Assessment Comment  Reviewed: Allergy & Precautions, H&P , NPO status , Patient's Chart, lab work & pertinent test results  Airway Mallampati: II TM Distance: >3 FB Neck ROM: Full    Dental  (+) Teeth Intact   Pulmonary  clear to auscultation  pulmonary exam normalPulmonary Exam Normal breath sounds clear to auscultation none    Cardiovascular + Past MI Regular Normal    Neuro/Psych    (+) Anxiety, Depression,  Negative Neurological ROS  Negative Psych ROS  GI/Hepatic/Renal negative GI ROS  negative Liver ROS  negative Renal ROS   GERD Controlled and Medicated     Endo/Other  Negative Endocrine ROS (+)      Abdominal Normal abdominal exam  (+)   Musculoskeletal negative musculoskeletal ROS (+)   Hematology negative hematology ROS (+)   Peds  Reproductive/Obstetrics negative OB ROS    Anesthesia Other Findings             Anesthesia Physical Anesthesia Plan  ASA: II  Anesthesia Plan: General   Post-op Pain Management:    Induction: Intravenous  Airway Management Planned: Oral ETT  Additional Equipment:   Intra-op Plan:   Post-operative Plan: Extubation in OR  Informed Consent: I have reviewed the patients History and Physical, chart, labs and discussed the procedure including the risks, benefits and alternatives for the proposed anesthesia with the patient or authorized representative who has indicated his/her understanding and acceptance.   Dental advisory given  Plan Discussed with: CRNA and Anesthesiologist  Anesthesia Plan Comments:         Anesthesia Quick Evaluation

## 2011-02-11 NOTE — Transfer of Care (Signed)
Immediate Anesthesia Transfer of Care Note  Patient: [name]  Procedure(s) Performed:  HYSTERECTOMY VAGINAL; TRANSVAGINAL TAPE (TVT) PROCEDURE; CYSTOSCOPY  Patient Location: PACU  Anesthesia Type: General  Level of Consciousness: awake, oriented and patient cooperative  Airway & Oxygen Therapy: Patient Spontanous Breathing and Patient connected to nasal cannula oxygen  Post-op Assessment: Report given to PACU RN and Post -op Vital signs reviewed and stable  Post vital signs: stable  Complications: No apparent anesthesia complications

## 2011-02-11 NOTE — H&P (Signed)
Pre Op History and Physical  NAME:  [name], [name]                    ACCOUNT NO.:      MEDICAL RECORD NO.:  [id number]      LOCATION:                                 FACILITY:      PHYSICIAN:  Randye Lobo, M.D.   DATE OF BIRTH:  [birth year]      DATE OF ADMISSION:   DATE OF DISCHARGE:                                 HISTORY & PHYSICAL         CHIEF COMPLAINT:  Heavy and irregular menstrual bleeding and urinary   incontinence.      HISTORY OF PRESENT ILLNESS:  The patient is a 45 year old gravida 4,   para 2-0-2-2 Caucasian female, status post bilateral tubal ligation, who   presents with heavy and irregular menstrual bleeding and urinary   incontinence.  The patient reports bleeding in between her cycles and   also heavy flow during her menses, which causes her to bleed through   pads.  The patient has recently stopped her birth control pills   secondary to weight gain.  An office ultrasound on January 02, 2011,   documented a uterus with an endometrial stripe of 23.18 cm.  There were   no fibroids.  The ovaries were normal.  An endometrial biopsy on January 02, 2011, documented benign secretory endometrium.      The patient reports urinary incontinence with coughing and sneezing.   She reports recent treatment with Cymbalta for depression.  This was   followed by a weight gain and then urinary retention.  The patient has   been status post urethral dilation x2 in 2012 secondary to urinary   retention.  This was performed by Dr. Jerre Simon at [facility].  The patient carries a diagnosis of some urethral stenosis.   She has had cystoscopy documenting a normal bladder.  She had a urinary   tract ultrasound on Oct 25, 2010, which showed unremarkable kidneys and a   normal bladder.  Cystometric studies were performed by Dr. Jerre Simon and   the office, and the report is unavailable.  The patient reports that   since discontinuing the Cymbalta, her voiding pattern is now  normal.   She reports that her urinary incontinence has worsened.      The patient is now requesting hysterectomy and surgical treatment for   urinary incontinence.  She declines future childbearing.      PAST GYNECOLOGIC HISTORY:  Status post spontaneous vaginal delivery x2.   Status post missed abortion x2.  Status post bilateral tubal ligation in   1995.  Last Pap smear December 19, 2010, was within normal limits.  Last   mammogram December 20, 2010, and was within normal limits.      PAST MEDICAL HISTORY:   1. Gastroesophageal reflux disease.   2. Depression.   3. Urinary attention.      PAST SURGICAL HISTORY:  Status post urethral dilation x3.  The first   urethral dilation was 11 years ago due to recurrent urinary tract   infections.  She is status post  urethral dilation x2 in 2012 for urinary   retention.      MEDICATIONS:  Wellbutrin, Paxil, and Dexilant.      ALLERGIES:  No known drug allergies.      SOCIAL HISTORY:  The patient has been married for 23 years.  She works   as an Print production planner for [employer].  She denies the use   of tobacco, alcohol, or illicit drugs.      FAMILY HISTORY:  Negative for breast, ovarian, uterine, or colon cancer.      Positive for renal failure in the patient's mother.      REVIEW OF SYSTEMS:  The patient reports an increase in lower extremity   edema since stopping Yaz birth control pills.      PHYSICAL EXAMINATION:  VITAL SIGNS:  Height 5 feet 3 inches, weight 206   pounds, blood pressure 130/80.   LUNGS:  Clear to auscultation bilaterally.   HEART:  S1 and S2 with a regular rate and rhythm.   ABDOMEN:  Soft and nontender without evidence of hepatosplenomegaly or   organomegaly.   PELVIC:  Normal external genitalia and urethra.  The cervix and vagina   demonstrate no lesions.  There is minimal uterine prolapse.  The   anterior and posterior vaginal walls appear to be well supported.  The   uterus is small and nontender.   There is no evidence of any adnexal   masses nor tenderness.      IMPRESSION:  The patient is a 45 year old gravida 4, para 2-0-2-2   Caucasian female, status post bilateral tubal ligation with   menometrorrhagia and genuine stress incontinence.      PLAN:  The patient will undergo a total vaginal hysterectomy with a TVT   and midurethral sling and cystoscopy at the [facility] of   [place] on February 11, 2011.  Risks, benefits, and alternatives have   been discussed with the patient who wishes to proceed.               Randye Lobo, M.D.               BES/MEDQ  D:  02/10/2011  T:  02/10/2011  Job:  [id number]

## 2011-02-11 NOTE — H&P (Signed)
  Update Note  No marked change in status.  OK to proceed with surgery.

## 2011-02-12 LAB — CBC
MCH: 24.9 pg — ABNORMAL LOW (ref 26.0–34.0)
MCV: 79.7 fL (ref 78.0–100.0)
Platelets: 385 10*3/uL (ref 150–400)
RDW: 14.5 % (ref 11.5–15.5)
WBC: 18.4 10*3/uL — ABNORMAL HIGH (ref 4.0–10.5)

## 2011-02-12 LAB — BASIC METABOLIC PANEL
Calcium: 8.3 mg/dL — ABNORMAL LOW (ref 8.4–10.5)
Creatinine, Ser: 0.58 mg/dL (ref 0.50–1.10)
GFR calc Af Amer: >60 mL/min (ref >60)

## 2011-02-12 NOTE — Progress Notes (Signed)
POD #1  S -  RLQ pain this am.  No flatus.  Packing still in.  Foley out.  Some nausea. O -  AVSS        UO 1500 cc  Lungs - CTA bilateratlly  Cor - S1S2 RRR  Abd - + bowel sounds, soft, nontender, nondistended.  Suprapubic incisions clean, dry, intact.  Labs - WBC - 18.4, Hgb - 10.7 A/P- S/P TVH, TVT Exact midurethral sling, cystoscopy.  Leulocytosis post op.  Afebrile.  - Vaginal packing removed by me.  - Bladder training.  - Advance diet.  - D/C PCa and start po pain meds.  - Continue in hospital care.  -CBC with differential in am.

## 2011-02-13 LAB — DIFFERENTIAL
Basophils Absolute: 0 10*3/uL (ref 0.0–0.1)
Lymphocytes Relative: 33 % (ref 12–46)
Neutro Abs: 6 10*3/uL (ref 1.7–7.7)
Neutrophils Relative %: 56 % (ref 43–77)

## 2011-02-13 LAB — CBC
Platelets: 351 10*3/uL (ref 150–400)
RDW: 14.8 % (ref 11.5–15.5)
WBC: 10.7 10*3/uL — ABNORMAL HIGH (ref 4.0–10.5)

## 2011-02-13 MED ORDER — CIPROFLOXACIN HCL 250 MG PO TABS: 250.0000 mg | ORAL_TABLET | Freq: Two times a day (BID) | ORAL | Status: AC

## 2011-02-13 MED ORDER — IBUPROFEN 600 MG PO TABS: 600.0000 mg | ORAL_TABLET | Freq: Four times a day (QID) | ORAL | Status: AC | PRN

## 2011-02-13 MED ORDER — DSS 100 MG PO CAPS: 100.0000 mg | ORAL_CAPSULE | Freq: Every day | ORAL | Status: AC

## 2011-02-13 MED ORDER — OXYCODONE-ACETAMINOPHEN 5-325 MG PO TABS: 1.0000 | ORAL_TABLET | ORAL | Status: AC | PRN

## 2011-02-13 NOTE — Progress Notes (Signed)
POD #2  S- Foley in overnight due to high residuals yesterday. O- AVSS  SP incisions - mild ecchymoses, incisions intact.  No hematoma  Vaginal pad - minor mucousy drainage.  WBC - 10.7 A/P- S/P TVH, TVT, cysto.  - Voiding trials this am.  If high residuals again, D/C home with Foley.  - D/C home.  - Rx - Percocet and Cipro prophylaxis.  - F/U in 4 days.  -Instructions and precautions given.

## 2011-02-13 NOTE — Discharge Summary (Signed)
Physician Discharge Summary  Patient ID: [name] MRN: [id number] DOB/AGE: 1966/05/12 45 y.o.  Admit date: 02/11/2011 Discharge date: 02/13/2011  Admission Diagnoses: Menorrhagia. Genuine stress incontinence.  Discharge Diagnoses:  Menorrhagia. Genuine stress incontinence.    Discharged Condition: Good.  Hospital Course: Leucocytosis approximately 18,000 post op day one which reduced to 10,700 by post op day number two.  Patient had no fevers post op.  She had large post void residuals over 100cc on post op day one.  She will resume voiding trials prior to discharge on post op day two and be discharge to home with a Foley if the volumes are again over 100 cc.  Consults:   Significant Diagnostic Studies:   Treatments: Status post total vaginal hysterectomy with TVT Exact midurethral sling and cystoscopy on 02/11/11  Discharge Exam: Blood pressure 98/64, pulse 76, temperature 97.7 F (36.5 C), temperature source Oral, resp. rate 16, height 5\' 2"  (1.575 m), weight 91.627 kg (202 lb), last menstrual period 01/28/2011, SpO2 91.00%. Benign exam of incision area.  No significant bleeding.  Disposition: Home or Self Care  Discharge Orders    Future Orders Please Complete By Expires   Increase activity slowly      Discharge instructions      Comments:   Call for fever, heavy vaginal bleeding, increasing pain, nausea and vomiting, difficulty voiding if you go home without a catheter, or any other concern.   Driving Restrictions      Comments:   No driving for 2 weeks or while taking Percocet.   Lifting restrictions      Comments:   No lifting over ten pounds for 8 weeks.  No exercise for  8 weeks.   Sexual Activity Restrictions      Comments:   No intercourse for 8 weeks.   Other Restrictions      Comments:   No tampon use.   Discharge wound care:      Comments:   Antibacterial soap and water.     Current Discharge Medication List    START taking these medications     Details  ciprofloxacin (CIPRO) 250 MG tablet Take 1 tablet (250 mg total) by mouth 2 (two) times daily. Qty: 20 tablet, Refills: 0    docusate sodium 100 MG CAPS Take 100 mg by mouth daily. Qty: 10 capsule    oxyCODONE-acetaminophen (PERCOCET) 5-325 MG per tablet Take 1-2 tablets by mouth every 4 (four) hours as needed (moderate to severe pain (when tolerating fluids)). Qty: 30 tablet, Refills: 0      CONTINUE these medications which have CHANGED   Details  ibuprofen (ADVIL,MOTRIN) 600 MG tablet Take 1 tablet (600 mg total) by mouth every 6 (six) hours as needed for pain (mild pain). Qty: 30 tablet      CONTINUE these medications which have NOT CHANGED   Details  buPROPion (WELLBUTRIN SR) 150 MG 12 hr tablet Take 150 mg by mouth 2 (two) times daily.      dexlansoprazole (DEXILANT) 60 MG capsule Take 60 mg by mouth daily.         Follow-up Information    Follow up with SILVA,Jerrelle Michelsen A. Call in 4 days.   Contact information:   [address] [place] [address] [phone]          Signed: Melony Overly 02/13/2011, 7:56 AM

## 2011-03-03 ENCOUNTER — Encounter (HOSPITAL_COMMUNITY): Payer: Self-pay | Admitting: Obstetrics and Gynecology

## 2011-07-29 ENCOUNTER — Other Ambulatory Visit: Payer: Self-pay | Admitting: Obstetrics and Gynecology

## 2011-07-29 DIAGNOSIS — N63 Unspecified lump in unspecified breast: Secondary | ICD-10-CM

## 2011-08-05 ENCOUNTER — Ambulatory Visit
Admission: RE | Admit: 2011-08-05 | Discharge: 2011-08-05 | Disposition: A | Discharge status: Home / Self Care | Payer: BC Managed Care – PPO | Source: Ambulatory Visit | Attending: Obstetrics and Gynecology | Admitting: Obstetrics and Gynecology

## 2011-08-05 DIAGNOSIS — N63 Unspecified lump in unspecified breast: Secondary | ICD-10-CM

## 2011-12-29 ENCOUNTER — Other Ambulatory Visit: Payer: Self-pay | Admitting: Obstetrics and Gynecology

## 2014-02-08 ENCOUNTER — Other Ambulatory Visit: Payer: Self-pay | Admitting: Obstetrics and Gynecology

## 2014-02-09 LAB — CYTOLOGY - PAP

## 2014-10-06 DIAGNOSIS — K219 Gastro-esophageal reflux disease without esophagitis: Secondary | ICD-10-CM | POA: Insufficient documentation

## 2019-11-16 ENCOUNTER — Other Ambulatory Visit (HOSPITAL_COMMUNITY): Payer: Self-pay | Admitting: Physician Assistant

## 2019-11-16 ENCOUNTER — Other Ambulatory Visit: Payer: Self-pay

## 2019-11-16 ENCOUNTER — Ambulatory Visit (HOSPITAL_COMMUNITY)
Admission: RE | Admit: 2019-11-16 | Discharge: 2019-11-16 | Disposition: A | Discharge status: Home / Self Care | Payer: BC Managed Care – PPO | Source: Ambulatory Visit | Attending: Physician Assistant | Admitting: Physician Assistant

## 2019-11-16 DIAGNOSIS — R222 Localized swelling, mass and lump, trunk: Secondary | ICD-10-CM | POA: Diagnosis not present

## 2021-01-02 DIAGNOSIS — M25552 Pain in left hip: Secondary | ICD-10-CM | POA: Insufficient documentation

## 2021-03-25 ENCOUNTER — Ambulatory Visit: Payer: BC Managed Care – PPO | Admitting: Urology

## 2021-03-29 ENCOUNTER — Ambulatory Visit: Payer: BC Managed Care – PPO | Admitting: Urology

## 2021-04-24 ENCOUNTER — Other Ambulatory Visit: Payer: Self-pay

## 2021-04-24 ENCOUNTER — Ambulatory Visit: Payer: BC Managed Care – PPO | Admitting: Urology

## 2021-04-24 ENCOUNTER — Encounter: Payer: Self-pay | Admitting: Urology

## 2021-04-24 VITALS — BP 144/67 | HR 85 | Temp 98.0°F

## 2021-04-24 DIAGNOSIS — N390 Urinary tract infection, site not specified: Secondary | ICD-10-CM | POA: Diagnosis not present

## 2021-04-24 LAB — URINALYSIS, ROUTINE W REFLEX MICROSCOPIC
Bilirubin, UA: NEGATIVE
Glucose, UA: NEGATIVE
Ketones, UA: NEGATIVE
Leukocytes,UA: NEGATIVE
Nitrite, UA: NEGATIVE
Protein,UA: NEGATIVE
RBC, UA: NEGATIVE
Specific Gravity, UA: >1.03 — ABNORMAL HIGH (ref 1.005–1.030)
Urobilinogen, Ur: 0.2 mg/dL (ref 0.2–1.0)
pH, UA: 5.5 (ref 5.0–7.5)

## 2021-04-24 NOTE — Progress Notes (Signed)
Urological Symptom Review  Patient is experiencing the following symptoms: Hard to postpone urination Get up at night to urinate Leakage of urine Urinary tract infection   Review of Systems  Gastrointestinal (upper)  : Indigestion/heartburn  Gastrointestinal (lower) : Negative for lower GI symptoms  Constitutional : Negative for symptoms  Skin: Negative for skin symptoms  Eyes: Negative for eye symptoms  Ear/Nose/Throat : Negative for Ear/Nose/Throat symptoms  Hematologic/Lymphatic: Negative for Hematologic/Lymphatic symptoms  Cardiovascular : Leg swelling  Respiratory : Negative for respiratory symptoms  Endocrine: Negative for endocrine symptoms  Musculoskeletal: Back pain Joint pain  Neurological: Negative for neurological symptoms  Psychologic: Anxiety

## 2021-04-24 NOTE — Progress Notes (Signed)
04/24/2021 3:40 PM   [name] 12-Jun-1966 [id number]  Referring provider: Ignatius Specking, MD [address] [place],  [place] [address]  Recurrent UTI   HPI: [name] is a 55yo here for evaluation of recurrent UTI. She has had 3 UTIs in the past 6 months. She previously saw Dr. Jerre Simon and underwent urethral dilation in 2012. She has pelvic pressure and gross hematuria with her UTIs. PVR 10cc. She has baseline urinary frequency every 2-3 hours. aShe has urinary urgency especially with drinking water. UA today is normal.  No tobacco abuse history. No history of nephrolithiasis. No other complaints today.    PMH: Past Medical History:  Diagnosis Date   Anxiety    Bladder neck stricture    Depression    GERD (gastroesophageal reflux disease)    Headache(784.0)     Surgical History: Past Surgical History:  Procedure Laterality Date   BLADDER NECK RECONSTRUCTION     BLADDER SUSPENSION  02/11/2011   Procedure: TRANSVAGINAL TAPE (TVT) PROCEDURE;  Surgeon: Melony Overly;  Location: [facility];  Service: Gynecology;  Laterality: N/A;   btl     CYSTOSCOPY  02/11/2011   Procedure: CYSTOSCOPY;  Surgeon: Melony Overly;  Location: [facility];  Service: Gynecology;  Laterality: N/A;   svd      x 2   TUBAL LIGATION     VAGINAL HYSTERECTOMY  02/11/2011   Procedure: HYSTERECTOMY VAGINAL;  Surgeon: Melony Overly;  Location: [facility];  Service: Gynecology;  Laterality: N/A;    Home Medications:  Allergies as of 04/24/2021   No Known Allergies      Medication List        Accurate as of April 24, 2021  3:40 PM. If you have any questions, ask your nurse or doctor.          STOP taking these medications    buPROPion 150 MG 12 hr tablet Commonly known as: WELLBUTRIN SR Stopped by: Wilkie Aye, MD       TAKE these medications    dexlansoprazole 60 MG capsule Commonly known as: DEXILANT Take 60 mg by mouth daily.        Allergies: No Known Allergies  Family History: Family  History  Problem Relation Age of Onset   Anesthesia problems Mother     Social History:  reports that she has never smoked. She has never used smokeless tobacco. She reports that she does not drink alcohol and does not use drugs.  ROS: All other review of systems were reviewed and are negative except what is noted above in HPI  Physical Exam: BP (!) 144/67   Pulse 85   Temp 98 F (36.7 C)   LMP 01/28/2011   Constitutional:  Alert and oriented, No acute distress. HEENT: [place] AT, moist mucus membranes.  Trachea midline, no masses. Cardiovascular: No clubbing, cyanosis, or edema. Respiratory: Normal respiratory effort, no increased work of breathing. GI: Abdomen is soft, nontender, nondistended, no abdominal masses GU: No CVA tenderness.  Lymph: No cervical or inguinal lymphadenopathy. Skin: No rashes, bruises or suspicious lesions. Neurologic: Grossly intact, no focal deficits, moving all 4 extremities. Psychiatric: Normal mood and affect.  Laboratory Data: Lab Results  Component Value Date   WBC 10.7 (H) 02/13/2011   HGB 10.2 (L) 02/13/2011   HCT 33.2 (L) 02/13/2011   MCV 80.6 02/13/2011   PLT 351 02/13/2011    Lab Results  Component Value Date   CREATININE 0.58 02/12/2011    No  results found for: PSA  No results found for: TESTOSTERONE  No results found for: HGBA1C  Urinalysis    Component Value Date/Time   COLORURINE YELLOW 01/31/2011 1030   APPEARANCEUR CLEAR 01/31/2011 1030   LABSPEC 1.010 01/31/2011 1030   PHURINE 6.0 01/31/2011 1030   GLUCOSEU NEGATIVE 01/31/2011 1030   HGBUR LARGE (A) 01/31/2011 1030   BILIRUBINUR NEGATIVE 01/31/2011 1030   KETONESUR 15 (A) 01/31/2011 1030   PROTEINUR NEGATIVE 01/31/2011 1030   UROBILINOGEN 0.2 01/31/2011 1030   NITRITE NEGATIVE 01/31/2011 1030   LEUKOCYTESUR TRACE (A) 01/31/2011 1030    Lab Results  Component Value Date   BACTERIA RARE 01/31/2011    Pertinent Imaging:  No results found for this or any  previous visit.  No results found for this or any previous visit.  No results found for this or any previous visit.  No results found for this or any previous visit.  Results for orders placed during the hospital encounter of 10/25/10  US Renal  Narrative *RADIOLOGY REPORT*  Clinical Data: Acute urinary retention  RENAL/URINARY TRACT ULTRASOUND COMPLETE  Comparison:  None  Findings:  Right Kidney:  10.6 cm length.  Normal cortical thickness and echogenicity.  No mass, hydronephrosis or shadowing calcification. No perinephric fluid.  Left Kidney:  10.7 cm length. Fetal lobulation of renal cortex, normal variant.  Normal cortical thickness and echogenicity.  No mass, hydronephrosis or shadowing calcification.  No perinephric fluid.  Bladder:  Bladder decompressed by Foley catheter, inadequately evaluated. Incidentally noted question minimally complicated right ovarian cyst 3.9 x 3.0 x 2.8 cm and probable fatty infiltration of liver.  IMPRESSION: Unremarkable kidneys. Urinary bladder decompressed by Foley catheter. Right ovarian cyst, question minimally complicated. Probable fatty infiltration of liver.  Original Report Authenticated By: Burnetta Sabin, M.D.  No results found for this or any previous visit.  No results found for this or any previous visit.  No results found for this or any previous visit.   Assessment & Plan:    1. Recurrent UTI --We discussed the natural hx of recurrent UTIs and the various causes. We discussed the treatment options including post coital prophylaxis, daily prophylaxis, topical estrogen therapy. We will obtain CT hematuria scan due to hx of gross hematuria.  - Urinalysis, Routine w reflex microscopic - BLADDER SCAN AMB NON-IMAGING   No follow-ups on file.  Nicolette Bang, MD  [facility]

## 2021-04-24 NOTE — Progress Notes (Signed)
post void residual = 10 ml

## 2021-04-24 NOTE — Patient Instructions (Signed)

## 2021-04-25 LAB — BASIC METABOLIC PANEL
BUN/Creatinine Ratio: 10 (ref 9–23)
BUN: 10 mg/dL (ref 6–24)
CO2: 25 mmol/L (ref 20–29)
Calcium: 9.6 mg/dL (ref 8.7–10.2)
Chloride: 102 mmol/L (ref 96–106)
Creatinine, Ser: 0.96 mg/dL (ref 0.57–1.00)
Glucose: 101 mg/dL — ABNORMAL HIGH (ref 70–99)
Potassium: 5.1 mmol/L (ref 3.5–5.2)
Sodium: 141 mmol/L (ref 134–144)
eGFR: 71 mL/min/{1.73_m2} (ref >59)

## 2021-06-14 ENCOUNTER — Other Ambulatory Visit: Payer: Self-pay

## 2021-06-14 ENCOUNTER — Encounter (HOSPITAL_COMMUNITY): Payer: Self-pay

## 2021-06-14 ENCOUNTER — Ambulatory Visit (HOSPITAL_COMMUNITY)
Admission: RE | Admit: 2021-06-14 | Discharge: 2021-06-14 | Disposition: A | Discharge status: Home / Self Care | Payer: BC Managed Care – PPO | Source: Ambulatory Visit | Attending: Urology | Admitting: Urology

## 2021-06-14 DIAGNOSIS — N390 Urinary tract infection, site not specified: Secondary | ICD-10-CM

## 2021-06-14 LAB — POCT I-STAT CREATININE: Creatinine, Ser: 0.9 mg/dL (ref 0.44–1.00)

## 2021-06-14 MED ORDER — IOHEXOL 300 MG/ML  SOLN
125.0000 mL | Freq: Once | INTRAMUSCULAR | Status: AC | PRN
  Administered 2021-06-14: 09:00:00 125 mL via INTRAVENOUS

## 2021-06-21 ENCOUNTER — Encounter: Payer: Self-pay | Admitting: Urology

## 2021-06-21 ENCOUNTER — Ambulatory Visit: Payer: BC Managed Care – PPO | Admitting: Urology

## 2021-06-21 ENCOUNTER — Other Ambulatory Visit: Payer: Self-pay

## 2021-06-21 VITALS — BP 146/88 | HR 76

## 2021-06-21 DIAGNOSIS — N390 Urinary tract infection, site not specified: Secondary | ICD-10-CM

## 2021-06-21 LAB — URINALYSIS, ROUTINE W REFLEX MICROSCOPIC
Bilirubin, UA: NEGATIVE
Glucose, UA: NEGATIVE
Ketones, UA: NEGATIVE
Leukocytes,UA: NEGATIVE
Nitrite, UA: NEGATIVE
Protein,UA: NEGATIVE
RBC, UA: NEGATIVE
Specific Gravity, UA: >1.03 — ABNORMAL HIGH (ref 1.005–1.030)
Urobilinogen, Ur: 0.2 mg/dL (ref 0.2–1.0)
pH, UA: 5 (ref 5.0–7.5)

## 2021-06-21 MED ORDER — NITROFURANTOIN MACROCRYSTAL 50 MG PO CAPS: 50.0000 mg | ORAL_CAPSULE | Freq: Every day | ORAL | 3 refills | Status: DC

## 2021-06-21 MED ORDER — CIPROFLOXACIN HCL 500 MG PO TABS
500.0000 mg | ORAL_TABLET | Freq: Once | ORAL | Status: AC
Start: 2021-06-21 — End: 2021-06-21
  Administered 2021-06-21: 10:00:00 500 mg via ORAL

## 2021-06-21 NOTE — Progress Notes (Signed)
Urological Symptom Review  Patient is experiencing the following symptoms: Get up at night to urinate Leakage of urine Urinary tract infection   Review of Systems  Gastrointestinal (upper)  : Indigestion/heartburn  Gastrointestinal (lower) : Negative for lower GI symptoms  Constitutional : Night Sweats  Skin: Negative for skin symptoms  Eyes: Negative for eye symptoms  Ear/Nose/Throat : Negative for Ear/Nose/Throat symptoms  Hematologic/Lymphatic: Negative for Hematologic/Lymphatic symptoms  Cardiovascular : Negative for cardiovascular symptoms  Respiratory : Negative for respiratory symptoms  Endocrine: Negative for endocrine symptoms  Musculoskeletal: Negative for musculoskeletal symptoms  Neurological: Negative for neurological symptoms  Psychologic: Negative for psychiatric symptoms

## 2021-06-21 NOTE — Patient Instructions (Signed)
Urinary Tract Infection, Adult A urinary tract infection (UTI) is an infection of any part of the urinary tract. The urinary tract includes the kidneys, ureters, bladder, and urethra. These organs make, store, and get rid of urine in the body. An upper UTI affects the ureters and kidneys. A lower UTI affects the bladder and urethra. What are the causes? Most urinary tract infections are caused by bacteria in your genital area around your urethra, where urine leaves your body. These bacteria grow and cause inflammation of your urinary tract. What increases the risk? You are more likely to develop this condition if: You have a urinary catheter that stays in place. You are not able to control when you urinate or have a bowel movement (incontinence). You are female and you: Use a spermicide or diaphragm for birth control. Have low estrogen levels. Are pregnant. You have certain genes that increase your risk. You are sexually active. You take antibiotic medicines. You have a condition that causes your flow of urine to slow down, such as: An enlarged prostate, if you are female. Blockage in your urethra. A kidney stone. A nerve condition that affects your bladder control (neurogenic bladder). Not getting enough to drink, or not urinating often. You have certain medical conditions, such as: Diabetes. A weak disease-fighting system (immunesystem). Sickle cell disease. Gout. Spinal cord injury. What are the signs or symptoms? Symptoms of this condition include: Needing to urinate right away (urgency). Frequent urination. This may include small amounts of urine each time you urinate. Pain or burning with urination. Blood in the urine. Urine that smells bad or unusual. Trouble urinating. Cloudy urine. Vaginal discharge, if you are female. Pain in the abdomen or the lower back. You may also have: Vomiting or a decreased appetite. Confusion. Irritability or tiredness. A fever or  chills. Diarrhea. The first symptom in older adults may be confusion. In some cases, they may not have any symptoms until the infection has worsened. How is this diagnosed? This condition is diagnosed based on your medical history and a physical exam. You may also have other tests, including: Urine tests. Blood tests. Tests for STIs (sexually transmitted infections). If you have had more than one UTI, a cystoscopy or imaging studies may be done to determine the cause of the infections. How is this treated? Treatment for this condition includes: Antibiotic medicine. Over-the-counter medicines to treat discomfort. Drinking enough water to stay hydrated. If you have frequent infections or have other conditions such as a kidney stone, you may need to see a health care provider who specializes in the urinary tract (urologist). In rare cases, urinary tract infections can cause sepsis. Sepsis is a life-threatening condition that occurs when the body responds to an infection. Sepsis is treated in the hospital with IV antibiotics, fluids, and other medicines. Follow these instructions at home: Medicines Take over-the-counter and prescription medicines only as told by your health care provider. If you were prescribed an antibiotic medicine, take it as told by your health care provider. Do not stop using the antibiotic even if you start to feel better. General instructions Make sure you: Empty your bladder often and completely. Do not hold urine for long periods of time. Empty your bladder after sex. Wipe from front to back after urinating or having a bowel movement if you are female. Use each tissue only one time when you wipe. Drink enough fluid to keep your urine pale yellow. Keep all follow-up visits. This is important. Contact a health care provider   if: Your symptoms do not get better after 1-2 days. Your symptoms go away and then return. Get help right away if: You have severe pain in your  back or your lower abdomen. You have a fever or chills. You have nausea or vomiting. Summary A urinary tract infection (UTI) is an infection of any part of the urinary tract, which includes the kidneys, ureters, bladder, and urethra. Most urinary tract infections are caused by bacteria in your genital area. Treatment for this condition often includes antibiotic medicines. If you were prescribed an antibiotic medicine, take it as told by your health care provider. Do not stop using the antibiotic even if you start to feel better. Keep all follow-up visits. This is important. This information is not intended to replace advice given to you by your health care provider. Make sure you discuss any questions you have with your health care provider. Document Revised: 01/20/2020 Document Reviewed: 01/20/2020 Elsevier Patient Education  2022 Elsevier Inc.  

## 2021-06-21 NOTE — Progress Notes (Signed)
° °  06/21/21  CC: recurrent UTI  HPI: Ms Molner is a 55yo here for followup for gross hematuria and recurrent UTI. CT hematuria showed a punctate right renal calculus Blood pressure (!) 146/88, pulse 76, last menstrual period 01/28/2011. NED. A&Ox3.   No respiratory distress   Abd soft, NT, ND Normal external genitalia with patent urethral meatus  Cystoscopy Procedure Note  Patient identification was confirmed, informed consent was obtained, and patient was prepped using Betadine solution.  Lidocaine jelly was administered per urethral meatus.    Procedure: - Flexible cystoscope introduced, without any difficulty.   - Thorough search of the bladder revealed:    normal urethral meatus    normal urothelium    no stones    no ulcers     no tumors    no urethral polyps    no trabeculation  - Ureteral orifices were normal in position and appearance.  Post-Procedure: - Patient tolerated the procedure well  Assessment/ Plan: -We discussed the natural hx of recurrent UTIs and the various causes. We discussed the treatment options including post coital prophylaxis, daily prophylaxis, topical estrogen therapy. We will trial macrobid 50mg  qhs. RTC 3 months   No follow-ups on file.  , MD

## 2021-08-12 ENCOUNTER — Other Ambulatory Visit: Payer: Self-pay | Admitting: *Deleted

## 2021-08-12 DIAGNOSIS — D75839 Thrombocytosis, unspecified: Secondary | ICD-10-CM

## 2021-08-12 NOTE — Progress Notes (Signed)
New patient appt scheduled for 3/27 and labs week before. Lab orders entered

## 2021-08-13 ENCOUNTER — Telehealth: Payer: Self-pay | Admitting: Nurse Practitioner

## 2021-08-13 NOTE — Telephone Encounter (Signed)
Contacted patient to schedule lab and initial visit from referral, patient is scheduled and is aware of date and time.

## 2021-09-11 ENCOUNTER — Inpatient Hospital Stay: Payer: BC Managed Care – PPO | Attending: Oncology

## 2021-09-11 ENCOUNTER — Other Ambulatory Visit: Payer: Self-pay | Admitting: Nurse Practitioner

## 2021-09-11 ENCOUNTER — Other Ambulatory Visit: Payer: Self-pay

## 2021-09-11 DIAGNOSIS — D72820 Lymphocytosis (symptomatic): Secondary | ICD-10-CM | POA: Diagnosis not present

## 2021-09-11 DIAGNOSIS — Z9884 Bariatric surgery status: Secondary | ICD-10-CM | POA: Diagnosis not present

## 2021-09-11 DIAGNOSIS — D75839 Thrombocytosis, unspecified: Secondary | ICD-10-CM | POA: Insufficient documentation

## 2021-09-11 DIAGNOSIS — F32A Depression, unspecified: Secondary | ICD-10-CM | POA: Diagnosis not present

## 2021-09-11 DIAGNOSIS — E7800 Pure hypercholesterolemia, unspecified: Secondary | ICD-10-CM | POA: Insufficient documentation

## 2021-09-11 DIAGNOSIS — M1990 Unspecified osteoarthritis, unspecified site: Secondary | ICD-10-CM | POA: Insufficient documentation

## 2021-09-11 DIAGNOSIS — D509 Iron deficiency anemia, unspecified: Secondary | ICD-10-CM | POA: Insufficient documentation

## 2021-09-11 DIAGNOSIS — K219 Gastro-esophageal reflux disease without esophagitis: Secondary | ICD-10-CM | POA: Insufficient documentation

## 2021-09-11 LAB — CBC WITH DIFFERENTIAL (CANCER CENTER ONLY)
Abs Immature Granulocytes: 0.01 10*3/uL (ref 0.00–0.07)
Basophils Absolute: 0 10*3/uL (ref 0.0–0.1)
Basophils Relative: 1 %
Eosinophils Absolute: 0.3 10*3/uL (ref 0.0–0.5)
Eosinophils Relative: 3 %
HCT: 39.5 % (ref 36.0–46.0)
Hemoglobin: 12.1 g/dL (ref 12.0–15.0)
Immature Granulocytes: 0 %
Lymphocytes Relative: 46 %
Lymphs Abs: 3.5 10*3/uL (ref 0.7–4.0)
MCH: 24.4 pg — ABNORMAL LOW (ref 26.0–34.0)
MCHC: 30.6 g/dL (ref 30.0–36.0)
MCV: 79.6 fL — ABNORMAL LOW (ref 80.0–100.0)
Monocytes Absolute: 0.5 10*3/uL (ref 0.1–1.0)
Monocytes Relative: 7 %
Neutro Abs: 3.2 10*3/uL (ref 1.7–7.7)
Neutrophils Relative %: 43 %
Platelet Count: 389 10*3/uL (ref 150–400)
RBC: 4.96 MIL/uL (ref 3.87–5.11)
RDW: 15 % (ref 11.5–15.5)
WBC Count: 7.6 10*3/uL (ref 4.0–10.5)
nRBC: 0 % (ref 0.0–0.2)

## 2021-09-11 LAB — IRON AND TIBC
Iron: 53 ug/dL (ref 28–170)
Saturation Ratios: 12 % (ref 10.4–31.8)
TIBC: 449 ug/dL (ref 250–450)
UIBC: 396 ug/dL

## 2021-09-11 LAB — SAVE SMEAR(SSMR), FOR PROVIDER SLIDE REVIEW

## 2021-09-12 LAB — FERRITIN: Ferritin: 6 ng/mL — ABNORMAL LOW (ref 11–307)

## 2021-09-13 NOTE — Progress Notes (Signed)
New Hematology/Oncology Consult ? ? ?Requesting MD: Dr. Jerene Bears ? [id number] ? ?   ? ?Reason for Consult: Platelets and lymphocytes increased ? ?HPI: [name] is a 56 year old woman referred for evaluation of elevated platelets and lymphocytes.  As part of an annual physical exam she had a CBC on 08/05/2021.  Hemoglobin returned normal at 13.1, MCV 76 (79-97), white blood cell count 12.7 (3.4-10.8), absolute lymphocytes 5.3 (0.7-3.1), platelets 509,000 (150,000-450,000).  This compared to a CBC from 07/20/2020-hemoglobin 12.2, MCV 71 (79-97), white count 9.3, absolute lymphocytes 3.9 (0.7-3.1), platelets 494,000 (150,000-450,000); unremarkable chemistry panel. ? ?Past Medical History:  ?Diagnosis Date  ? Anxiety   ? Bladder neck stricture   ? Depression   ? GERD (gastroesophageal reflux disease)   ? Headache(784.0)   ?: ? ? ?Past Surgical History:  ?Procedure Laterality Date  ? BLADDER NECK RECONSTRUCTION    ? BLADDER SUSPENSION  02/11/2011  ? Procedure: TRANSVAGINAL TAPE (TVT) PROCEDURE;  Surgeon: Arloa Koh;  Location: [facility];  Service: Gynecology;  Laterality: N/A;  ? btl    ? CYSTOSCOPY  02/11/2011  ? Procedure: CYSTOSCOPY;  Surgeon: Arloa Koh;  Location: [facility];  Service: Gynecology;  Laterality: N/A;  ? svd     ? x 2  ? TUBAL LIGATION    ? VAGINAL HYSTERECTOMY  02/11/2011  ? Procedure: HYSTERECTOMY VAGINAL;  Surgeon: Arloa Koh;  Location: [facility];  Service: Gynecology;  Laterality: N/A;  ?Gastric sleeve surgery 01/09/2015 ? ? ?Current Outpatient Medications:  ?  buPROPion (WELLBUTRIN XL) 300 MG 24 hr tablet, bupropion HCl XL 300 mg 24 hr tablet, extended release  TAKE ONE TABLET BY MOUTH DAILY, Disp: , Rfl:  ?  celecoxib (CELEBREX) 200 MG capsule, celecoxib 200 mg capsule  TAKE ONE CAPSULE BY MOUTH EVERY DAY, Disp: , Rfl:  ?  Cholecalciferol 1.25 MG (50000 UT) capsule, cholecalciferol (vitamin D3) 1,250 mcg (50,000 unit) capsule  TAKE ONE CAPSULE BY MOUTH ONCE WEEKLY, Disp: , Rfl:  ?   dexlansoprazole (DEXILANT) 60 MG capsule, Take 60 mg by mouth daily.  , Disp: , Rfl:  ?  estradiol (ESTRACE) 0.5 MG tablet, estradiol 0.5 mg tablet  TAKE ONE TABLET BY MOUTH EVERY DAY, Disp: , Rfl:  ?  nitrofurantoin (MACRODANTIN) 50 MG capsule, Take 1 capsule (50 mg total) by mouth at bedtime., Disp: 90 capsule, Rfl: 3 ?  rosuvastatin (CRESTOR) 20 MG tablet, Take 20 mg by mouth daily., Disp: , Rfl: : ? ? ?No Known Allergies: ? ?FH: Mother deceased age 6 with renal failure, father deceased age 65 with MI.  No family history of anemia or malignancy. ? ?SOCIAL HISTORY: She lives in [place].  She is married.  She has 2 children.  She does office work.  No tobacco use.  Occasional social intake of alcohol. ? ?Review of Systems: No fevers or sweats.  She has a good appetite.  No weight loss.  She denies bleeding.  She eats a regular diet.  She craves ice.  Notes nails are brittle.  Hair loss.  Malaise.  No tongue soreness.  Mild dyspnea on exertion.  No dysphagia.  No nausea or vomiting.  No change in bowel habits.  No black stools.  No hematuria.  She has intermittent leg swelling.  She has taken oral iron in the past with significant constipation. ? ?Physical Exam: ? ?Blood pressure (!) 168/74, pulse 89, temperature 97.8 ?F (36.6 ?C), temperature source Oral, resp. rate 18, height 5'  2" (1.575 m), weight 214 lb (97.1 kg), last menstrual period 01/28/2011, SpO2 98 %. ? ?HEENT: No thrush or ulcers. ?Lungs: Lungs clear bilaterally. ?Cardiac: Regular rate and rhythm. ?Abdomen: Abdomen soft and nontender.  No hepatosplenomegaly. ?Vascular: No leg edema. ?Lymph nodes: No palpable cervical, supraclavicular, axillary or inguinal lymph nodes. ?Neurologic: Alert and oriented.  Gait normal. ?Skin: No rash. ? ?LABS: ?Peripheral blood smear-platelets appear mildly increased; majority of the white blood cells are mature appearing neutrophils and lymphocytes, rare atypical mononuclear cells, multiple 5 lobed  neutrophils; few ovalocytes, rare teardrop and target, no nucleated red blood cells, polychromasia not increased, red cells are microcytic. ? ?RADIOLOGY: ? ?No results found. ? ?Assessment and Plan:  ? ?Iron deficiency anemia ?Thrombocytosis ?Elevated lymphocytes ?History of gastric sleeve surgery 2016 ?Arthritis ?Hypercholesterolemia ?Depression ?Reflux ? ?[name] was referred for evaluation of thrombocytosis and elevated lymphocytes.  She has iron deficiency which may be the cause for the elevated platelet count.  She is not aware of any bleeding.  The iron deficiency is likely secondary to the gastric sleeve surgery.  We made a referral to gastroenterology and will obtain a urine to evaluate for blood.  She has been unable to tolerate oral iron in the past.  We discussed IV iron and the chance for an allergic reaction.  She would like to proceed with IV iron.  We will try to get this scheduled for next week.  The lymphocyte count is mildly elevated.  We will continue to continue to monitor. ? ?The peripheral blood smear is consistent with iron deficiency and also possible B12 deficiency.  We are obtaining a B12 level today, myeloma panel and LDH. ? ?She will return for lab and follow-up in 3 months. ? ?Patient seen with Dr. Benay Spice. ? ? ? ?Ned Card, NP ?09/16/2021, 3:21 PM  ? ?This was a shared visit with Ned Card.  [name] was interviewed and examined.  I reviewed the peripheral blood smear.  She is referred for evaluation of Red cell microcytosis and recent leukocytosis/thrombocytosis.  She has iron deficiency.  Her hemoglobin is at the low end of normal range, but she feels symptoms from the iron deficiency.  She would like to proceed with a trial of IV iron therapy.  She reports developing severe constipation with oral iron in the past.  We reviewed potential toxicities associated with IV iron including the chance of anaphylaxis.  She agrees to proceed. ? ?We obtained additional diagnostic  evaluation to look for evidence of vitamin B12 deficiency. ? ?I suspect the iron deficiency is related to the gastric sleeve procedure.  We will make a referral to gastroenterology as she is due for a colonoscopy. ? ?I was present for greater than 50% of today's visit.  I performed medical decision making. ? ?Julieanne Manson, MD ?

## 2021-09-16 ENCOUNTER — Inpatient Hospital Stay: Payer: BC Managed Care – PPO

## 2021-09-16 ENCOUNTER — Encounter: Payer: Self-pay | Admitting: Nurse Practitioner

## 2021-09-16 ENCOUNTER — Inpatient Hospital Stay: Payer: BC Managed Care – PPO | Admitting: Nurse Practitioner

## 2021-09-16 ENCOUNTER — Other Ambulatory Visit: Payer: Self-pay

## 2021-09-16 VITALS — BP 168/74 | HR 89 | Temp 97.8°F | Resp 18 | Ht 62.0 in | Wt 214.0 lb

## 2021-09-16 DIAGNOSIS — D509 Iron deficiency anemia, unspecified: Secondary | ICD-10-CM | POA: Insufficient documentation

## 2021-09-16 DIAGNOSIS — R61 Generalized hyperhidrosis: Secondary | ICD-10-CM | POA: Insufficient documentation

## 2021-09-16 DIAGNOSIS — D75839 Thrombocytosis, unspecified: Secondary | ICD-10-CM

## 2021-09-16 LAB — URINALYSIS, COMPLETE (UACMP) WITH MICROSCOPIC
Bilirubin Urine: NEGATIVE
Glucose, UA: NEGATIVE mg/dL
Hgb urine dipstick: NEGATIVE
Ketones, ur: NEGATIVE mg/dL
Leukocytes,Ua: NEGATIVE
Nitrite: NEGATIVE
Protein, ur: NEGATIVE mg/dL
Specific Gravity, Urine: 1.018 (ref 1.005–1.030)
pH: 6 (ref 5.0–8.0)

## 2021-09-16 LAB — VITAMIN B12: Vitamin B-12: 383 pg/mL (ref 180–914)

## 2021-09-16 LAB — LACTATE DEHYDROGENASE: LDH: 146 U/L (ref 98–192)

## 2021-09-17 ENCOUNTER — Encounter: Payer: Self-pay | Admitting: *Deleted

## 2021-09-17 LAB — KAPPA/LAMBDA LIGHT CHAINS
Kappa free light chain: 23.9 mg/L — ABNORMAL HIGH (ref 3.3–19.4)
Kappa, lambda light chain ratio: 1.36 (ref 0.26–1.65)
Lambda free light chains: 17.6 mg/L (ref 5.7–26.3)

## 2021-09-17 NOTE — Progress Notes (Signed)
Faxed referral order and office note to [facility] @[phone] for Dx: IDA ?

## 2021-09-18 ENCOUNTER — Ambulatory Visit: Payer: BC Managed Care – PPO | Admitting: Internal Medicine

## 2021-09-18 ENCOUNTER — Encounter: Payer: Self-pay | Admitting: Internal Medicine

## 2021-09-18 VITALS — BP 120/82 | HR 71 | Ht 61.0 in | Wt 212.4 lb

## 2021-09-18 DIAGNOSIS — E611 Iron deficiency: Secondary | ICD-10-CM | POA: Diagnosis not present

## 2021-09-18 DIAGNOSIS — Z9884 Bariatric surgery status: Secondary | ICD-10-CM | POA: Diagnosis not present

## 2021-09-18 DIAGNOSIS — K219 Gastro-esophageal reflux disease without esophagitis: Secondary | ICD-10-CM

## 2021-09-18 NOTE — Patient Instructions (Addendum)
You have been scheduled for an endoscopy and colonoscopy. Please follow the written instructions given to you at your visit today. ?Please pick up your prep supplies at the pharmacy within the next 1-3 days. ?If you use inhalers (even only as needed), please bring them with you on the day of your procedure. ? ?If you are age 55 or older, your body mass index should be between 23-30. Your Body mass index is 40.13 kg/m?Marland Kitchen If this is out of the aforementioned range listed, please consider follow up with your Primary Care Provider. ? ?If you are age 89 or younger, your body mass index should be between 19-25. Your Body mass index is 40.13 kg/m?Marland Kitchen If this is out of the aformentioned range listed, please consider follow up with your Primary Care Provider.  ? ?________________________________________________________ ? ?The [facility] providers would like to encourage you to use [facility] to communicate with providers for non-urgent requests or questions.  Due to long hold times on the telephone, sending your provider a message by [facility] may be a faster and more efficient way to get a response.  Please allow 48 business hours for a response.  Please remember that this is for non-urgent requests.  ?_______________________________________________________ ?  ? ?I appreciate the opportunity to care for you. ?Stan Head, MD, [facility] ?

## 2021-09-18 NOTE — Progress Notes (Signed)
? ?[name] 56 y.o. 1966/03/15 [id number] ? ?Assessment & Plan:  ? ?Encounter Diagnoses  ?Name Primary?  ? Iron deficiency Yes  ? Gastroesophageal reflux disease, unspecified whether esophagitis present   ? S/P laparoscopic sleeve gastrectomy - 2016   ? ?Evaluate iron deficiency anemia with EGD and colonoscopy.  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  She also has limited iron in her diet it seems.  Intolerant of oral iron plan is for parenteral iron therapy. ? ?Continue Dexilant for GERD. ? ?The risks and benefits as well as alternatives of endoscopic procedure(s) have been discussed and reviewed. All questions answered. The patient agrees to proceed. ? ? ?Copy to Ned Card, NP ?Glenda Chroman, MD ? ? ? ? ?Subjective:  ? ?Chief Complaint: Iron deficiency anemia ? ?HPI ?56 year old white woman status post sleeve gastrectomy with iron deficiency anemia referred by hematology for evaluation.  She has a history of sleeve gastrectomy in 2016 and hysterectomy in 2012.  There has been some iron deficiency with low ferritin's over the years, she is intolerant of oral iron due to constipation.  She does not eat red meats, dark green leafy vegetables to any significant degree it sounds like.  She has had a long history of reflux pre and post sleeve gastrectomy which is well controlled with 60 mg Dexilant daily. ? ?Colonoscopy 9 or 10 years ago apparently okay.  We do not have those records.  It was performed in [place]. ? ?GI review of systems is otherwise negative bowel habits are regular there is no sign of rectal bleeding she has not been a blood donor. ? ?She saw hematology 09/16/2021.  I have reviewed that note. ? ?Lab Results  ?Component Value Date  ? FERRITIN 6 (L) 09/11/2021  ? ?Lab Results  ?Component Value Date  ? [id number] 09/16/2021  ? ?Lab Results  ?Component Value Date  ? WBC 7.6 09/11/2021  ? HGB 12.1 09/11/2021  ? HCT 39.5 09/11/2021  ? MCV 79.6 (L) 09/11/2021  ?  PLT 389 09/11/2021  ? ? ?No Known Allergies ?Current Meds  ?Medication Sig  ? buPROPion (WELLBUTRIN XL) 300 MG 24 hr tablet bupropion HCl XL 300 mg 24 hr tablet, extended release ? TAKE ONE TABLET BY MOUTH DAILY  ? celecoxib (CELEBREX) 200 MG capsule celecoxib 200 mg capsule ? TAKE ONE CAPSULE BY MOUTH EVERY DAY  ? Cholecalciferol 1.25 MG (50000 UT) capsule cholecalciferol (vitamin D3) 1,250 mcg (50,000 unit) capsule ? TAKE ONE CAPSULE BY MOUTH ONCE WEEKLY  ? dexlansoprazole (DEXILANT) 60 MG capsule Take 60 mg by mouth daily.    ? estradiol (ESTRACE) 0.5 MG tablet estradiol 0.5 mg tablet ? TAKE ONE TABLET BY MOUTH EVERY DAY  ? nitrofurantoin (MACRODANTIN) 50 MG capsule Take 1 capsule (50 mg total) by mouth at bedtime.  ? rosuvastatin (CRESTOR) 20 MG tablet Take 20 mg by mouth daily.  ? ?Past Medical History:  ?Diagnosis Date  ? Anxiety   ? Bladder neck stricture   ? Depression   ? GERD (gastroesophageal reflux disease)   ? Headache(784.0)   ? ?Past Surgical History:  ?Procedure Laterality Date  ? BLADDER NECK RECONSTRUCTION    ? BLADDER SUSPENSION  02/11/2011  ? Procedure: TRANSVAGINAL TAPE (TVT) PROCEDURE;  Surgeon: Arloa Koh;  Location: [facility];  Service: Gynecology;  Laterality: N/A;  ? btl    ? CYSTOSCOPY  02/11/2011  ? Procedure: CYSTOSCOPY;  Surgeon: Arloa Koh;  Location: [facility];  Service:  Gynecology;  Laterality: N/A;  ? svd     ? x 2  ? TUBAL LIGATION    ? VAGINAL HYSTERECTOMY  02/11/2011  ? Procedure: HYSTERECTOMY VAGINAL;  Surgeon: Arloa Koh;  Location: [facility];  Service: Gynecology;  Laterality: N/A;  ? ?Social History  ? ?Social History Narrative  ? She is married  ? 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 No alcohol tobacco or drug use  ? ?family history includes Anesthesia problems in her mother. ? ? ?Review of Systems ?Otherwise negative see HPI ? ?Objective:  ? Physical Exam ?@BP  120/82   Pulse 71   Ht 5\' 1"  (1.549 m)   Wt 212 lb 6.4 oz (96.3 kg)   LMP 01/28/2011    SpO2 97%   BMI 40.13 kg/m? @ ? ?General:  Well-developed, well-nourished and in no acute distress-obese ?Eyes:  anicteric.  ?Lungs: Clear to auscultation bilaterally. ?Heart:   S1S2, no rubs, murmurs, gallops. ?Abdomen:  soft, non-tender, no hepatosplenomegaly, hernia, or mass and BS+.  ?Rectal: Deferred until colonoscopy ?Extremities:   no edema, cyanosis or clubbing ?Skin   no rash.  Some tattoos ?Neuro:  A&O x 3.  ?Psych:  appropriate mood and  Affect. ? ? ?Data Reviewed: ?See HPI ? ?

## 2021-09-20 LAB — MULTIPLE MYELOMA PANEL, SERUM
Albumin SerPl Elph-Mcnc: 3.4 g/dL (ref 2.9–4.4)
Albumin/Glob SerPl: 1.1 (ref 0.7–1.7)
Alpha 1: 0.3 g/dL (ref 0.0–0.4)
Alpha2 Glob SerPl Elph-Mcnc: 0.8 g/dL (ref 0.4–1.0)
B-Globulin SerPl Elph-Mcnc: 1.1 g/dL (ref 0.7–1.3)
Gamma Glob SerPl Elph-Mcnc: 1 g/dL (ref 0.4–1.8)
Globulin, Total: 3.3 g/dL (ref 2.2–3.9)
IgA: 349 mg/dL (ref 87–352)
IgG (Immunoglobin G), Serum: 1056 mg/dL (ref 586–1602)
IgM (Immunoglobulin M), Srm: 63 mg/dL (ref 26–217)
Total Protein ELP: 6.7 g/dL (ref 6.0–8.5)

## 2021-09-25 ENCOUNTER — Ambulatory Visit (INDEPENDENT_AMBULATORY_CARE_PROVIDER_SITE_OTHER): Payer: BC Managed Care – PPO | Admitting: Urology

## 2021-09-25 ENCOUNTER — Encounter: Payer: Self-pay | Admitting: Urology

## 2021-09-25 VITALS — BP 136/78 | HR 82

## 2021-09-25 DIAGNOSIS — N3021 Other chronic cystitis with hematuria: Secondary | ICD-10-CM

## 2021-09-25 DIAGNOSIS — N390 Urinary tract infection, site not specified: Secondary | ICD-10-CM | POA: Diagnosis not present

## 2021-09-25 LAB — URINALYSIS, ROUTINE W REFLEX MICROSCOPIC
Bilirubin, UA: NEGATIVE
Glucose, UA: NEGATIVE
Ketones, UA: NEGATIVE
Leukocytes,UA: NEGATIVE
Nitrite, UA: NEGATIVE
Protein,UA: NEGATIVE
RBC, UA: NEGATIVE
Specific Gravity, UA: 1.015 (ref 1.005–1.030)
Urobilinogen, Ur: 0.2 mg/dL (ref 0.2–1.0)
pH, UA: 6 (ref 5.0–7.5)

## 2021-09-25 MED ORDER — NITROFURANTOIN MACROCRYSTAL 50 MG PO CAPS: 50.0000 mg | ORAL_CAPSULE | Freq: Every day | ORAL | 3 refills | Status: DC

## 2021-09-25 NOTE — Patient Instructions (Signed)
Asymptomatic Bacteriuria Asymptomatic bacteriuria is the presence of a large number of bacteria in the urine without the usual symptoms of burning or frequent urination. This is usually not harmful, and treatment may not be needed. A person with this condition will not be more likely to develop an infection in the future. What are the causes? This condition is caused by an increase in bacteria in the urine. This increase can be caused by: Bacteria entering the urinary tract, such as during sex. A blockage in the urinary tract, such as from kidney stones or a tumor. Bladder problems that prevent the bladder from emptying. What increases the risk? You are more likely to develop this condition if: You have diabetes. You are an older adult. This especially affects older adults in long-term care facilities. You are pregnant and in the first trimester. You have kidney stones. You are female. You have had a kidney transplant. You have a leaky kidney tube valve (reflux). You had a urinary catheter for a long period of time. This is a long, thin tube that collects urine. What are the signs or symptoms? There are no symptoms of this condition. How is this diagnosed? This condition is diagnosed with a urine test. Because this condition does not cause symptoms, it is usually diagnosed when a urine sample is taken to treat or diagnose another condition, such as pregnancy or kidney problems. Most women who are in their first trimester of pregnancy are screened for asymptomatic bacteriuria. How is this treated? Usually, treatment is not needed for this condition. Treating the condition can lead to other problems, such as a yeast infection or the growth of bacteria that do not respond to treatment (antibiotic-resistant bacteria). Some people do need treatment with antibiotic medicines to prevent kidney infection, known as pyelonephritis. Treatment is needed if: You are pregnant. In pregnant women, kidney  infection can lead to: Early labor (premature labor). Very low birth weight (fetal growth restriction). Newborn death. You are having a procedure that affects the urinary tract. You have had a kidney transplant. If you are diagnosed with this condition, talk with your health care provider about any concerns that you have. Follow these instructions at home: Medicines Take over-the-counter and prescription medicines only as told by your health care provider. If you were prescribed an antibiotic medicine, take it as told by your health care provider. Do not stop using the antibiotic even if you start to feel better. General instructions Monitor your condition for any changes. Drink enough fluid to keep your urine pale yellow. Urinate more often to keep your bladder empty. If you are female, keep the area around your vagina and rectum clean. Wipe from front to back after urinating or having a bowel movement. Use each piece of toilet paper only once. Keep all follow-up visits. This is important. Contact a health care provider if: You have symptoms of a urine infection, such as: A burning sensation, or pain when you urinate. A strong need to urinate, or urinating more often. Urine turning discolored or cloudy. Blood in your urine. Urine that smells bad. Get help right away if: You develop signs of a kidney infection, such as: Back pain or pelvic pain. A fever or chills. Nausea or vomiting. Severe pain that cannot be controlled with medicine. Summary Asymptomatic bacteriuria is the presence of a large number of bacteria in the urine without the usual symptoms of burning or frequent urination. Usually, treatment is not needed for this condition. Treating the condition can lead   to other problems, such as a yeast infection or the growth of bacteria that do not respond to treatment. Some people do need treatment. Treatment is needed if you are pregnant, if you are having a procedure that  affects the urinary tract, or if you have had a kidney transplant. If you were prescribed an antibiotic medicine, take it as told by your health care provider. Do not stop using the antibiotic even if you start to feel better. This information is not intended to replace advice given to you by your health care provider. Make sure you discuss any questions you have with your health care provider. Document Revised: 01/20/2020 Document Reviewed: 01/20/2020 Elsevier Patient Education  2022 [employer]  

## 2021-09-25 NOTE — Progress Notes (Signed)
? ?09/25/2021 ?3:31 PM  ? ?[name] ?03-15-1966 ?[id number] ? ?Referring provider: Ignatius Specking, MD ?[address] ?[place],  [place] [address] ? ?Recurrent UTI ? ? ?HPI: ?Ms Gutridge is a 56yo here for followup for recurrent UTIs. She was started on macrobid 50mg  qhs for prophylaxis last visit. She denies any UTIs since last visit. She denies any worsening LUTS. No other complaints today ? ? ?PMH: ?Past Medical History:  ?Diagnosis Date  ? Anxiety   ? Bladder neck stricture   ? Depression   ? GERD (gastroesophageal reflux disease)   ? Headache(784.0)   ? ? ?Surgical History: ?Past Surgical History:  ?Procedure Laterality Date  ? BLADDER NECK RECONSTRUCTION    ? BLADDER SUSPENSION  02/11/2011  ? Procedure: TRANSVAGINAL TAPE (TVT) PROCEDURE;  Surgeon: 02/13/2011;  Location: [facility];  Service: Gynecology;  Laterality: N/A;  ? btl    ? CYSTOSCOPY  02/11/2011  ? Procedure: CYSTOSCOPY;  Surgeon: 02/13/2011;  Location: [facility];  Service: Gynecology;  Laterality: N/A;  ? svd     ? x 2  ? TUBAL LIGATION    ? VAGINAL HYSTERECTOMY  02/11/2011  ? Procedure: HYSTERECTOMY VAGINAL;  Surgeon: 02/13/2011;  Location: [facility];  Service: Gynecology;  Laterality: N/A;  ? ? ?Home Medications:  ?Allergies as of 09/25/2021   ?No Known Allergies ?  ? ?  ?Medication List  ?  ? ?  ? Accurate as of September 25, 2021  3:31 PM. If you have any questions, ask your nurse or doctor.  ?  ?  ? ?  ? ?buPROPion 300 MG 24 hr tablet ?Commonly known as: WELLBUTRIN XL ?bupropion HCl XL 300 mg 24 hr tablet, extended release ? TAKE ONE TABLET BY MOUTH DAILY ?  ?celecoxib 200 MG capsule ?Commonly known as: CELEBREX ?celecoxib 200 mg capsule ? TAKE ONE CAPSULE BY MOUTH EVERY DAY ?  ?Cholecalciferol 1.25 MG (50000 UT) capsule ?cholecalciferol (vitamin D3) 1,250 mcg (50,000 unit) capsule ? TAKE ONE CAPSULE BY MOUTH ONCE WEEKLY ?  ?dexlansoprazole 60 MG capsule ?Commonly known as: DEXILANT ?Take 60 mg by mouth daily. ?  ?estradiol 0.5 MG tablet ?Commonly known as:  ESTRACE ?estradiol 0.5 mg tablet ? TAKE ONE TABLET BY MOUTH EVERY DAY ?  ?FeroSul 325 (65 FE) MG tablet ?Generic drug: ferrous sulfate ?Take 325 mg by mouth daily. ?  ?nitrofurantoin 50 MG capsule ?Commonly known as: MACRODANTIN ?Take 1 capsule (50 mg total) by mouth at bedtime. ?  ?rosuvastatin 20 MG tablet ?Commonly known as: CRESTOR ?Take 20 mg by mouth daily. ?  ? ?  ? ? ?Allergies: No Known Allergies ? ?Family History: ?Family History  ?Problem Relation Age of Onset  ? Anesthesia problems Mother   ? ? ?Social History:  reports that she has never smoked. She has never used smokeless tobacco. She reports that she does not drink alcohol and does not use drugs. ? ?ROS: ?All other review of systems were reviewed and are negative except what is noted above in HPI ? ?Physical Exam: ?BP 136/78   Pulse 82   LMP 01/28/2011   ?Constitutional:  Alert and oriented, No acute distress. ?HEENT: [place] AT, moist mucus membranes.  Trachea midline, no masses. ?Cardiovascular: No clubbing, cyanosis, or edema. ?Respiratory: Normal respiratory effort, no increased work of breathing. ?GI: Abdomen is soft, nontender, nondistended, no abdominal masses ?GU: No CVA tenderness.  ?Lymph: No cervical or inguinal lymphadenopathy. ?Skin: No rashes, bruises or suspicious lesions. ?Neurologic:  Grossly intact, no focal deficits, moving all 4 extremities. ?Psychiatric: Normal mood and affect. ? ?Laboratory Data: ?Lab Results  ?Component Value Date  ? WBC 7.6 09/11/2021  ? HGB 12.1 09/11/2021  ? HCT 39.5 09/11/2021  ? MCV 79.6 (L) 09/11/2021  ? PLT 389 09/11/2021  ? ? ?Lab Results  ?Component Value Date  ? CREATININE 0.90 06/14/2021  ? ? ?No results found for: PSA ? ?No results found for: TESTOSTERONE ? ?No results found for: HGBA1C ? ?Urinalysis ?   ?Component Value Date/Time  ? COLORURINE YELLOW 09/16/2021 1456  ? APPEARANCEUR CLEAR 09/16/2021 1456  ? APPEARANCEUR Clear 06/21/2021 0945  ? LABSPEC 1.018 09/16/2021 1456  ? PHURINE 6.0 09/16/2021  1456  ? GLUCOSEU NEGATIVE 09/16/2021 1456  ? HGBUR NEGATIVE 09/16/2021 1456  ? BILIRUBINUR NEGATIVE 09/16/2021 1456  ? BILIRUBINUR Negative 06/21/2021 0945  ? KETONESUR NEGATIVE 09/16/2021 1456  ? PROTEINUR NEGATIVE 09/16/2021 1456  ? UROBILINOGEN 0.2 01/31/2011 1030  ? NITRITE NEGATIVE 09/16/2021 1456  ? LEUKOCYTESUR NEGATIVE 09/16/2021 1456  ? ? ?Lab Results  ?Component Value Date  ? LABMICR Comment 06/21/2021  ? BACTERIA FEW (A) 09/16/2021  ? ? ?Pertinent Imaging: ? ?No results found for this or any previous visit. ? ?No results found for this or any previous visit. ? ?No results found for this or any previous visit. ? ?No results found for this or any previous visit. ? ?Results for orders placed during the hospital encounter of 10/25/10 ? ?US Renal ? ?Narrative ?*RADIOLOGY REPORT* ? ?Clinical Data: Acute urinary retention ? ?RENAL/URINARY TRACT ULTRASOUND COMPLETE ? ?Comparison:  None ? ?Findings: ? ?Right Kidney:  10.6 cm length.  Normal cortical thickness and ?echogenicity.  No mass, hydronephrosis or shadowing calcification. ?No perinephric fluid. ? ?Left Kidney:  10.7 cm length. Fetal lobulation of renal cortex, ?normal variant.  Normal cortical thickness and echogenicity.  No ?mass, hydronephrosis or shadowing calcification.  No perinephric ?fluid. ? ?Bladder:  Bladder decompressed by Foley catheter, inadequately ?evaluated. ?Incidentally noted question minimally complicated right ovarian ?cyst 3.9 x 3.0 x 2.8 cm and probable fatty infiltration of liver. ? ?IMPRESSION: ?Unremarkable kidneys. ?Urinary bladder decompressed by Foley catheter. ?Right ovarian cyst, question minimally complicated. ?Probable fatty infiltration of liver. ? ?Original Report Authenticated By: Lollie MarrowMARK A. BOLES, M.D. ? ?No results found for this or any previous visit. ? ?Results for orders placed during the hospital encounter of 06/14/21 ? ?CT HEMATURIA WORKUP ? ?Narrative ?CLINICAL DATA:  Recurrent chronic UTIs x6 months.  Gross  hematuria. ? ?EXAM: ?CT ABDOMEN AND PELVIS WITHOUT AND WITH CONTRAST ? ?TECHNIQUE: ?Multidetector CT imaging of the abdomen and pelvis was performed ?following the standard protocol before and following the bolus ?administration of intravenous contrast. ? ?CONTRAST:  125mL OMNIPAQUE IOHEXOL 300 MG/ML  SOLN ? ?COMPARISON:  None. ? ?FINDINGS: ?Lower chest: Bibasilar atelectasis for scarring. ? ?Hepatobiliary: No suspicious hepatic lesion. Gallbladder is ?unremarkable. No biliary ductal dilation. ? ?Pancreas: No pancreatic ductal dilation or evidence of acute ?inflammation. ? ?Spleen: Within normal limits. ? ?Adrenals/Urinary Tract: Bilateral adrenal glands appear normal. ? ?No hydronephrosis. Punctate nonobstructive right interpolar renal ?calculus. No left-sided nephrolithiasis. No ureteral or bladder ?calculi identified. Pelvic phleboliths. ? ?Hypodense subcentimeter right interpolar renal lesion on image 35/13 ?is technically too small to accurately characterize but ?statistically likely to reflect a cyst. No solid enhancing renal ?mass. ? ?The bilateral kidneys demonstrate symmetric enhancement and ?excretion of contrast material. No collecting system duplication. No ?suspicious filling defect visualized within the opacified portions ?of the collecting  systems or ureters on delayed imaging. However, ?the distal right ureter is not well opacified limiting evaluation. ? ?Urinary bladder is unremarkable for degree of distension without ?asymmetric wall thickening or suspicious intraluminal filling defect ?identified. ? ?Stomach/Bowel: No enteric contrast was administered. 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 No pathologic ?dilation of small or large bowel. The appendix and terminal ileum ?appear normal. No suspicious colonic wall thickening or mass like ?lesions identified. Sigmoid colonic diverticulosis without findings ?of acute diverticulitis. ? ?Vascular/Lymphatic: Aortic atherosclerosis  without abdominal aortic ?aneurysm. No pathologically enlarged abdominal or pelvic lymph ?nodes. ? ?Reproductive: Status post hysterectomy. No adnexal masses. ? ?Other: No significant abdominopelvic free fluid. No ?pneumo

## 2021-09-27 ENCOUNTER — Inpatient Hospital Stay: Payer: BC Managed Care – PPO | Attending: Oncology

## 2021-09-27 VITALS — BP 140/68 | HR 60 | Temp 97.8°F | Resp 18 | Ht 61.0 in | Wt 218.0 lb

## 2021-09-27 DIAGNOSIS — Z79899 Other long term (current) drug therapy: Secondary | ICD-10-CM | POA: Insufficient documentation

## 2021-09-27 DIAGNOSIS — D509 Iron deficiency anemia, unspecified: Secondary | ICD-10-CM | POA: Insufficient documentation

## 2021-09-27 MED ORDER — LORATADINE 10 MG PO TABS
10.0000 mg | ORAL_TABLET | Freq: Every day | ORAL | Status: DC
  Administered 2021-09-27: 10 mg via ORAL
  Filled 2021-09-27: qty 1

## 2021-09-27 MED ORDER — SODIUM CHLORIDE 0.9 % IV SOLN: Freq: Once | INTRAVENOUS | Status: AC

## 2021-09-27 MED ORDER — SODIUM CHLORIDE 0.9 % IV SOLN
300.0000 mg | Freq: Once | INTRAVENOUS | Status: AC
  Administered 2021-09-27: 300 mg via INTRAVENOUS
  Filled 2021-09-27: qty 15

## 2021-09-27 NOTE — Patient Instructions (Signed)

## 2021-10-04 ENCOUNTER — Inpatient Hospital Stay: Payer: BC Managed Care – PPO

## 2021-10-04 VITALS — BP 134/53 | HR 73 | Temp 97.8°F | Resp 18 | Ht 61.0 in | Wt 215.0 lb

## 2021-10-04 DIAGNOSIS — D509 Iron deficiency anemia, unspecified: Secondary | ICD-10-CM | POA: Diagnosis not present

## 2021-10-04 MED ORDER — SODIUM CHLORIDE 0.9 % IV SOLN
300.0000 mg | Freq: Once | INTRAVENOUS | Status: AC
  Administered 2021-10-04: 300 mg via INTRAVENOUS
  Filled 2021-10-04: qty 15

## 2021-10-04 MED ORDER — LORATADINE 10 MG PO TABS
10.0000 mg | ORAL_TABLET | Freq: Every day | ORAL | Status: DC
  Administered 2021-10-04: 10 mg via ORAL
  Filled 2021-10-04: qty 1

## 2021-10-04 MED ORDER — SODIUM CHLORIDE 0.9 % IV SOLN: Freq: Once | INTRAVENOUS | Status: AC

## 2021-10-04 NOTE — Patient Instructions (Signed)

## 2021-10-04 NOTE — Progress Notes (Signed)
Patient monitored for 30 minutes post infusion.  VSS upon leaving infusion room.  

## 2021-11-06 ENCOUNTER — Encounter: Payer: Self-pay | Admitting: Nurse Practitioner

## 2021-11-07 ENCOUNTER — Encounter: Payer: Self-pay | Admitting: Internal Medicine

## 2021-11-08 ENCOUNTER — Encounter: Payer: Self-pay | Admitting: Certified Registered Nurse Anesthetist

## 2021-11-14 ENCOUNTER — Ambulatory Visit (AMBULATORY_SURGERY_CENTER): Payer: BC Managed Care – PPO | Admitting: Internal Medicine

## 2021-11-14 ENCOUNTER — Encounter: Payer: Self-pay | Admitting: Internal Medicine

## 2021-11-14 VITALS — BP 124/80 | HR 70 | Temp 97.1°F | Resp 16 | Ht 61.0 in | Wt 212.0 lb

## 2021-11-14 DIAGNOSIS — E611 Iron deficiency: Secondary | ICD-10-CM

## 2021-11-14 DIAGNOSIS — K5730 Diverticulosis of large intestine without perforation or abscess without bleeding: Secondary | ICD-10-CM

## 2021-11-14 DIAGNOSIS — Z9884 Bariatric surgery status: Secondary | ICD-10-CM

## 2021-11-14 DIAGNOSIS — K219 Gastro-esophageal reflux disease without esophagitis: Secondary | ICD-10-CM

## 2021-11-14 DIAGNOSIS — K2970 Gastritis, unspecified, without bleeding: Secondary | ICD-10-CM

## 2021-11-14 DIAGNOSIS — K2950 Unspecified chronic gastritis without bleeding: Secondary | ICD-10-CM | POA: Diagnosis not present

## 2021-11-14 MED ORDER — SODIUM CHLORIDE 0.9 % IV SOLN: 500.0000 mL | Freq: Once | INTRAVENOUS | Status: DC

## 2021-11-14 NOTE — Progress Notes (Signed)
Report given to PACU, vss 

## 2021-11-14 NOTE — Op Note (Signed)
[facility] Patient Name: [name] Procedure Date: 11/14/2021 7:30 AM MRN: [id number] Endoscopist: Iva Boop , MD Age: 56 Referring MD:  Date of Birth: 08-Mar-1966 Gender: Female Account #: [id number] Procedure:                Upper GI endoscopy Indications:              Iron deficiency anemia Medicines:                Monitored Anesthesia Care Procedure:                Pre-Anesthesia Assessment:                           - Prior to the procedure, a History and Physical                            was performed, and patient medications and                            allergies were reviewed. The patient's tolerance of                            previous anesthesia was also reviewed. The risks                            and benefits of the procedure and the sedation                            options and risks were discussed with the patient.                            All questions were answered, and informed consent                            was obtained. Prior Anticoagulants: The patient has                            taken no previous anticoagulant or antiplatelet                            agents. ASA Grade Assessment: II - A patient with                            mild systemic disease. After reviewing the risks                            and benefits, the patient was deemed in                            satisfactory condition to undergo the procedure.                           After obtaining informed consent, the endoscope was  passed under direct vision. Throughout the                            procedure, the patient's blood pressure, pulse, and                            oxygen saturations were monitored continuously. The                            GIF [id number] #[id number] was introduced through the                            mouth, and advanced to the second part of duodenum.                            The upper GI endoscopy was  accomplished without                            difficulty. The patient tolerated the procedure                            well. Scope In: Scope Out: Findings:                 The examined esophagus was normal.                           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 This was characterized by                            congestion, edema, erythema and inflammation.                            Biopsies were taken with a cold forceps for                            histology. Verification of patient identification                            for the specimen was done. Estimated blood loss was                            minimal.                           The examined duodenum was normal.                           The cardia and gastric fundus were normal on                            retroflexion. Complications:  No immediate complications. Estimated Blood Loss:     Estimated blood loss was minimal. Impression:               - Normal esophagus.                           - A sleeve gastrectomy was found, characterized by                            inflammation, congestion, edema and erythema. ?                            Gastritis, ? H pylori . Biopsied.                           - Normal examined duodenum. Recommendation:           - Patient has a contact number available for                            emergencies. The signs and symptoms of potential                            delayed complications were discussed with the                            patient. Return to normal activities tomorrow.                            Written discharge instructions were provided to the                            patient.                           - See the other procedure note for documentation of                            additional recommendations. Colonoscopy next                           - Await pathology results. Iva Booparl  E Chloey Ricard, MD 11/14/2021 8:33:55 AM This report has been signed electronically.

## 2021-11-14 NOTE — Progress Notes (Signed)
0803 Robinul 0.1 mg IV given due large amount of secretions upon assessment.  MD made aware, vss  

## 2021-11-14 NOTE — Progress Notes (Signed)
VS completed by CW.   Pt's states no medical or surgical changes since previsit or office visit.  

## 2021-11-14 NOTE — Progress Notes (Signed)
[facility] History and Physical   Primary Care Physician:  Glenda Chroman, MD   Reason for Procedure:   Iron deficiency anemia  Plan:    EGD and colonoscopy     HPI: [name] is a 56 y.o. female here to evaluate iron deficiency anemia. Seen in clinic 09/18/21. See that note also.   Past Medical History:  Diagnosis Date   Anxiety    Bladder neck stricture    Depression    GERD (gastroesophageal reflux disease)    Headache(784.0)     Past Surgical History:  Procedure Laterality Date   BLADDER NECK RECONSTRUCTION     BLADDER SUSPENSION  02/11/2011   Procedure: TRANSVAGINAL TAPE (TVT) PROCEDURE;  Surgeon: Arloa Koh;  Location: [facility];  Service: Gynecology;  Laterality: N/A;   btl     COLONOSCOPY     CYSTOSCOPY  02/11/2011   Procedure: CYSTOSCOPY;  Surgeon: Arloa Koh;  Location: [facility];  Service: Gynecology;  Laterality: N/A;   svd      x 2   TUBAL LIGATION     VAGINAL HYSTERECTOMY  02/11/2011   Procedure: HYSTERECTOMY VAGINAL;  Surgeon: Arloa Koh;  Location: [facility];  Service: Gynecology;  Laterality: N/A;    Prior to Admission medications   Medication Sig Start Date End Date Taking? Authorizing Provider  buPROPion (WELLBUTRIN XL) 300 MG 24 hr tablet bupropion HCl XL 300 mg 24 hr tablet, extended release  TAKE ONE TABLET BY MOUTH DAILY   Yes [provider]  celecoxib (CELEBREX) 200 MG capsule celecoxib 200 mg capsule  TAKE ONE CAPSULE BY MOUTH EVERY DAY 04/05/21  Yes [provider]  Cholecalciferol 1.25 MG (50000 UT) capsule cholecalciferol (vitamin D3) 1,250 mcg (50,000 unit) capsule  TAKE ONE CAPSULE BY MOUTH ONCE WEEKLY   Yes [provider]  dexlansoprazole (DEXILANT) 60 MG capsule Take 60 mg by mouth daily.     Yes [provider]  estradiol (ESTRACE) 0.5 MG tablet estradiol 0.5 mg tablet  TAKE ONE TABLET BY MOUTH EVERY DAY 06/23/17  Yes [provider]  FEROSUL 325 (65 Fe) MG tablet Take  325 mg by mouth daily. 08/29/21  Yes [provider]  nitrofurantoin (MACRODANTIN) 50 MG capsule Take 1 capsule (50 mg total) by mouth at bedtime. 09/25/21  Yes McKenzie, Candee Furbish, MD  rosuvastatin (CRESTOR) 20 MG tablet Take 20 mg by mouth daily.   Yes [provider]    Current Outpatient Medications  Medication Sig Dispense Refill   buPROPion (WELLBUTRIN XL) 300 MG 24 hr tablet bupropion HCl XL 300 mg 24 hr tablet, extended release  TAKE ONE TABLET BY MOUTH DAILY     celecoxib (CELEBREX) 200 MG capsule celecoxib 200 mg capsule  TAKE ONE CAPSULE BY MOUTH EVERY DAY     Cholecalciferol 1.25 MG (50000 UT) capsule cholecalciferol (vitamin D3) 1,250 mcg (50,000 unit) capsule  TAKE ONE CAPSULE BY MOUTH ONCE WEEKLY     dexlansoprazole (DEXILANT) 60 MG capsule Take 60 mg by mouth daily.       estradiol (ESTRACE) 0.5 MG tablet estradiol 0.5 mg tablet  TAKE ONE TABLET BY MOUTH EVERY DAY     FEROSUL 325 (65 Fe) MG tablet Take 325 mg by mouth daily.     nitrofurantoin (MACRODANTIN) 50 MG capsule Take 1 capsule (50 mg total) by mouth at bedtime. 90 capsule 3   rosuvastatin (CRESTOR) 20 MG tablet Take 20 mg by mouth daily.  Current Facility-Administered Medications  Medication Dose Route Frequency Provider Last Rate Last Admin   0.9 %  sodium chloride infusion  500 mL Intravenous Once Gatha Mayer, MD        Allergies as of 11/14/2021   (No Known Allergies)    Family History  Problem Relation Age of Onset   Anesthesia problems Mother     Social History   Socioeconomic History   Marital status: Married    Spouse name: Not on file   Number of children: Not on file   Years of education: Not on file   Highest education level: Not on file  Occupational History   Not on file  Tobacco Use   Smoking status: Never   Smokeless tobacco: Never  Vaping Use   Vaping Use: Never used  Substance and Sexual Activity   Alcohol use: No   Drug use: No   Sexual activity: Yes     Birth control/protection: Other-see comments    Comment: BTL  Other Topics Concern   Not on file  Social History Narrative   She is married   She works for a Administrator, sports in the office here in [place]   No alcohol tobacco or drug use   Social Determinants of Radio broadcast assistant Strain: Not on file  Food Insecurity: Not on file  Transportation Needs: Not on file  Physical Activity: Not on file  Stress: Not on file  Social Connections: Not on file  Intimate Partner Violence: Not on file    Review of Systems:  All other review of systems negative except as mentioned in the HPI.  Physical Exam: Vital signs BP (!) 150/90   Pulse 78   Temp (!) 97.1 F (36.2 C) (Temporal)   Ht 5\' 1"  (1.549 m)   Wt 212 lb (96.2 kg)   LMP 01/28/2011   SpO2 98%   BMI 40.06 kg/m   General:   Alert,  Well-developed, well-nourished, pleasant and cooperative in NAD Lungs:  Clear throughout to auscultation.   Heart:  Regular rate and rhythm; no murmurs, clicks, rubs,  or gallops. Abdomen:  Soft, nontender and nondistended. Normal bowel sounds.   Neuro/Psych:  Alert and cooperative. Normal mood and affect. A and O x 3   @[facility] [place]  Simonne Maffucci, MD, [facility] [phone] (pager) 11/14/2021 8:01 AM@

## 2021-11-14 NOTE — Patient Instructions (Addendum)
The stomach lining looked inflamed - I took biopsies to understand it and will let you know. Sometimes there is an infection that we treat.  Colon - no polyps or cancer. Nothing that would leak blood.  You do have diverticulosis - thickened muscle rings and pouches in the colon wall. Please read the handout about this condition.  I appreciate the opportunity to care for you. Iva Boop, MD, FACG  YOU HAD AN ENDOSCOPIC PROCEDURE TODAY AT THE [facility]:   Refer to the procedure report that was given to you for any specific questions about what was found during the examination.  If the procedure report does not answer your questions, please call your gastroenterologist to clarify.  If you requested that your care partner not be given the details of your procedure findings, then the procedure report has been included in a sealed envelope for you to review at your convenience later.  YOU SHOULD EXPECT: Some feelings of bloating in the abdomen. Passage of more gas than usual.  Walking can help get rid of the air that was put into your GI tract during the procedure and reduce the bloating. If you had a lower endoscopy (such as a colonoscopy or flexible sigmoidoscopy) you may notice spotting of blood in your stool or on the toilet paper. If you underwent a bowel prep for your procedure, you may not have a normal bowel movement for a few days.  Please Note:  You might notice some irritation and congestion in your nose or some drainage.  This is from the oxygen used during your procedure.  There is no need for concern and it should clear up in a day or so.  SYMPTOMS TO REPORT IMMEDIATELY:  Following lower endoscopy (colonoscopy or flexible sigmoidoscopy):  Excessive amounts of blood in the stool  Significant tenderness or worsening of abdominal pains  Swelling of the abdomen that is new, acute  Fever of 100F or higher  Following upper endoscopy (EGD)  Vomiting of blood or coffee  ground material  New chest pain or pain under the shoulder blades  Painful or persistently difficult swallowing  New shortness of breath  Fever of 100F or higher  Black, tarry-looking stools  For urgent or emergent issues, a gastroenterologist can be reached at any hour by calling [phone]. Do not use MyChart messaging for urgent concerns.    DIET:  We do recommend a small meal at first, but then you may proceed to your regular diet.  Drink plenty of fluids but you should avoid alcoholic beverages for 24 hours.  ACTIVITY:  You should plan to take it easy for the rest of today and you should NOT DRIVE or use heavy machinery until tomorrow (because of the sedation medicines used during the test).    FOLLOW UP: Our staff will call the number listed on your records 48-72 hours following your procedure to check on you and address any questions or concerns that you may have regarding the information given to you following your procedure. If we do not reach you, we will leave a message.  We will attempt to reach you two times.  During this call, we will ask if you have developed any symptoms of COVID 19. If you develop any symptoms (ie: fever, flu-like symptoms, shortness of breath, cough etc.) before then, please call [phone].  If you test positive for Covid 19 in the 2 weeks post procedure, please call and report this information to [place].  If any biopsies were taken you will be contacted by phone or by letter within the next 1-3 weeks.  Please call us at [phone] if you have not heard about the biopsies in 3 weeks.    SIGNATURES/CONFIDENTIALITY: You and/or your care partner have signed paperwork which will be entered into your electronic medical record.  These signatures attest to the fact that that the information above on your After Visit Summary has been reviewed and is understood.  Full responsibility of the confidentiality of this discharge information lies with you and/or  your care-partner.

## 2021-11-14 NOTE — Progress Notes (Signed)
Called to room to assist during endoscopic procedure.  Patient ID and intended procedure confirmed with present staff. Received instructions for my participation in the procedure from the performing physician.  

## 2021-11-14 NOTE — Op Note (Signed)
[facility] Patient Name: [name] Procedure Date: 11/14/2021 7:29 AM MRN: [id number] Endoscopist: Iva Boop , MD Age: 56 Referring MD:  Date of Birth: Jan 31, 1966 Gender: Female Account #: [id number] Procedure:                Colonoscopy Indications:              Iron deficiency anemia Medicines:                Monitored Anesthesia Care Procedure:                Pre-Anesthesia Assessment:                           - Prior to the procedure, a History and Physical                            was performed, and patient medications and                            allergies were reviewed. The patient's tolerance of                            previous anesthesia was also reviewed. The risks                            and benefits of the procedure and the sedation                            options and risks were discussed with the patient.                            All questions were answered, and informed consent                            was obtained. Prior Anticoagulants: The patient has                            taken no previous anticoagulant or antiplatelet                            agents. ASA Grade Assessment: II - A patient with                            mild systemic disease. After reviewing the risks                            and benefits, the patient was deemed in                            satisfactory condition to undergo the procedure.                           After obtaining informed consent, the colonoscope  was passed under direct vision. Throughout the                            procedure, the patient's blood pressure, pulse, and                            oxygen saturations were monitored continuously. The                            Olympus PCF-H190DL (#[id number]) Colonoscope was                            introduced through the anus and advanced to the the                            cecum, identified by appendiceal orifice  and                            ileocecal valve. The colonoscopy was performed                            without difficulty. The patient tolerated the                            procedure well. The quality of the bowel                            preparation was good. The ileocecal valve,                            appendiceal orifice, and rectum were photographed. Scope In: 8:15:23 AM Scope Out: 8:24:32 AM Scope Withdrawal Time: 0 hours 7 minutes 30 seconds  Total Procedure Duration: 0 hours 9 minutes 9 seconds  Findings:                 The digital rectal exam findings include decreased                            sphincter tone. Pertinent negatives include no                            palpable rectal lesions.                           Multiple diverticula were found in the sigmoid                            colon.                           The exam was otherwise without abnormality on                            direct and retroflexion views. Complications:            No immediate complications. Estimated Blood Loss:  Estimated blood loss: none. Impression:               - Decreased sphincter tone found on digital rectal                            exam.                           - Diverticulosis in the sigmoid colon.                           - The examination was otherwise normal on direct                            and retroflexion views.                           - No specimens collected. Recommendation:           - Patient has a contact number available for                            emergencies. The signs and symptoms of potential                            delayed complications were discussed with the                            patient. Return to normal activities tomorrow.                            Written discharge instructions were provided to the                            patient.                           - Resume previous diet.                           - Continue  present medications.                           - Repeat colonoscopy in 10 years for screening                            purposes.                           - See the other procedure note for documentation of                            additional recommendations. Iva Booparl E Hines Kloss, MD 11/14/2021 8:35:47 AM This report has been signed electronically.

## 2021-11-15 ENCOUNTER — Telehealth: Payer: Self-pay | Admitting: *Deleted

## 2021-11-15 NOTE — Telephone Encounter (Signed)
  Follow up Call-     11/14/2021    7:15 AM  Call back number  Post procedure Call Back phone  # [phone]  Permission to leave phone message Yes     Patient questions:  Do you have a fever, pain , or abdominal swelling? No. Pain Score  0 *  Have you tolerated food without any problems? Yes.    Have you been able to return to your normal activities? Yes.    Do you have any questions about your discharge instructions: Diet   No. Medications  No. Follow up visit  No.  Do you have questions or concerns about your Care? No.  Actions: * If pain score is 4 or above: No action needed, pain <4.

## 2021-11-15 NOTE — Telephone Encounter (Signed)
  Follow up Call-     11/14/2021    7:15 AM  Call back number  Post procedure Call Back phone  # [phone]  Permission to leave phone message Yes     Patient questions:  Message left to call us if necessary.

## 2021-11-26 ENCOUNTER — Encounter: Payer: Self-pay | Admitting: Internal Medicine

## 2021-12-17 ENCOUNTER — Inpatient Hospital Stay: Payer: BC Managed Care – PPO | Attending: Oncology

## 2021-12-17 ENCOUNTER — Inpatient Hospital Stay: Payer: BC Managed Care – PPO | Admitting: Oncology

## 2021-12-17 VITALS — BP 141/65 | HR 80 | Temp 98.2°F | Resp 18 | Ht 61.0 in | Wt 211.0 lb

## 2021-12-17 DIAGNOSIS — D75839 Thrombocytosis, unspecified: Secondary | ICD-10-CM

## 2021-12-17 DIAGNOSIS — E7800 Pure hypercholesterolemia, unspecified: Secondary | ICD-10-CM | POA: Insufficient documentation

## 2021-12-17 DIAGNOSIS — Z9884 Bariatric surgery status: Secondary | ICD-10-CM | POA: Insufficient documentation

## 2021-12-17 DIAGNOSIS — M1990 Unspecified osteoarthritis, unspecified site: Secondary | ICD-10-CM | POA: Insufficient documentation

## 2021-12-17 DIAGNOSIS — F32A Depression, unspecified: Secondary | ICD-10-CM | POA: Insufficient documentation

## 2021-12-17 DIAGNOSIS — D509 Iron deficiency anemia, unspecified: Secondary | ICD-10-CM | POA: Diagnosis present

## 2021-12-17 LAB — CBC WITH DIFFERENTIAL (CANCER CENTER ONLY)
Abs Immature Granulocytes: 0.01 10*3/uL (ref 0.00–0.07)
Basophils Absolute: 0 10*3/uL (ref 0.0–0.1)
Basophils Relative: 1 %
Eosinophils Absolute: 0.2 10*3/uL (ref 0.0–0.5)
Eosinophils Relative: 3 %
HCT: 43.8 % (ref 36.0–46.0)
Hemoglobin: 13.9 g/dL (ref 12.0–15.0)
Immature Granulocytes: 0 %
Lymphocytes Relative: 40 %
Lymphs Abs: 2.6 10*3/uL (ref 0.7–4.0)
MCH: 26.1 pg (ref 26.0–34.0)
MCHC: 31.7 g/dL (ref 30.0–36.0)
MCV: 82.3 fL (ref 80.0–100.0)
Monocytes Absolute: 0.6 10*3/uL (ref 0.1–1.0)
Monocytes Relative: 9 %
Neutro Abs: 3.1 10*3/uL (ref 1.7–7.7)
Neutrophils Relative %: 47 %
Platelet Count: 303 10*3/uL (ref 150–400)
RBC: 5.32 MIL/uL — ABNORMAL HIGH (ref 3.87–5.11)
RDW: 15.8 % — ABNORMAL HIGH (ref 11.5–15.5)
WBC Count: 6.5 10*3/uL (ref 4.0–10.5)
nRBC: 0 % (ref 0.0–0.2)

## 2021-12-17 LAB — FERRITIN: Ferritin: 60 ng/mL (ref 11–307)

## 2021-12-18 ENCOUNTER — Telehealth: Payer: Self-pay

## 2021-12-18 NOTE — Telephone Encounter (Signed)
-----   Message from Ladene Artist, MD sent at 12/17/2021  3:55 PM EDT ----- Please call patient, ferritin is normal, call for symptoms of anemia, f/u as scheduled

## 2021-12-18 NOTE — Telephone Encounter (Signed)
Called and informed patient of information listed below.  Patient verbalized understanding.  Reminded patient of upcoming appointment in December and instructed patient to call office with any questions or concerns.  Patient verbalized understanding.  All questions were answered during phone call.

## 2022-03-25 ENCOUNTER — Encounter: Payer: Self-pay | Admitting: Nurse Practitioner

## 2022-03-28 ENCOUNTER — Encounter: Payer: Self-pay | Admitting: Nurse Practitioner

## 2022-04-01 ENCOUNTER — Ambulatory Visit (INDEPENDENT_AMBULATORY_CARE_PROVIDER_SITE_OTHER): Payer: 59 | Admitting: Urology

## 2022-04-01 ENCOUNTER — Encounter: Payer: Self-pay | Admitting: Urology

## 2022-04-01 VITALS — BP 129/82 | HR 81

## 2022-04-01 DIAGNOSIS — N3021 Other chronic cystitis with hematuria: Secondary | ICD-10-CM

## 2022-04-01 DIAGNOSIS — N390 Urinary tract infection, site not specified: Secondary | ICD-10-CM

## 2022-04-01 LAB — URINALYSIS, ROUTINE W REFLEX MICROSCOPIC
Bilirubin, UA: NEGATIVE
Glucose, UA: NEGATIVE
Ketones, UA: NEGATIVE
Leukocytes,UA: NEGATIVE
Nitrite, UA: NEGATIVE
Protein,UA: NEGATIVE
Specific Gravity, UA: 1.02 (ref 1.005–1.030)
Urobilinogen, Ur: 0.2 mg/dL (ref 0.2–1.0)
pH, UA: 5.5 (ref 5.0–7.5)

## 2022-04-01 LAB — MICROSCOPIC EXAMINATION

## 2022-04-01 MED ORDER — NITROFURANTOIN MACROCRYSTAL 50 MG PO CAPS: 50.0000 mg | ORAL_CAPSULE | Freq: Every day | ORAL | 3 refills | Status: DC

## 2022-04-01 NOTE — Patient Instructions (Signed)
Urinary Tract Infection, Adult  A urinary tract infection (UTI) is an infection of any part of the urinary tract. The urinary tract includes the kidneys, ureters, bladder, and urethra. These organs make, store, and get rid of urine in the body. An upper UTI affects the ureters and kidneys. A lower UTI affects the bladder and urethra. What are the causes? Most urinary tract infections are caused by bacteria in your genital area around your urethra, where urine leaves your body. These bacteria grow and cause inflammation of your urinary tract. What increases the risk? You are more likely to develop this condition if: You have a urinary catheter that stays in place. You are not able to control when you urinate or have a bowel movement (incontinence). You are female and you: Use a spermicide or diaphragm for birth control. Have low estrogen levels. Are pregnant. You have certain genes that increase your risk. You are sexually active. You take antibiotic medicines. You have a condition that causes your flow of urine to slow down, such as: An enlarged prostate, if you are female. Blockage in your urethra. A kidney stone. A nerve condition that affects your bladder control (neurogenic bladder). Not getting enough to drink, or not urinating often. You have certain medical conditions, such as: Diabetes. A weak disease-fighting system (immunesystem). Sickle cell disease. Gout. Spinal cord injury. What are the signs or symptoms? Symptoms of this condition include: Needing to urinate right away (urgency). Frequent urination. This may include small amounts of urine each time you urinate. Pain or burning with urination. Blood in the urine. Urine that smells bad or unusual. Trouble urinating. Cloudy urine. Vaginal discharge, if you are female. Pain in the abdomen or the lower back. You may also have: Vomiting or a decreased appetite. Confusion. Irritability or tiredness. A fever or  chills. Diarrhea. The first symptom in older adults may be confusion. In some cases, they may not have any symptoms until the infection has worsened. How is this diagnosed? This condition is diagnosed based on your medical history and a physical exam. You may also have other tests, including: Urine tests. Blood tests. Tests for STIs (sexually transmitted infections). If you have had more than one UTI, a cystoscopy or imaging studies may be done to determine the cause of the infections. How is this treated? Treatment for this condition includes: Antibiotic medicine. Over-the-counter medicines to treat discomfort. Drinking enough water to stay hydrated. If you have frequent infections or have other conditions such as a kidney stone, you may need to see a health care provider who specializes in the urinary tract (urologist). In rare cases, urinary tract infections can cause sepsis. Sepsis is a life-threatening condition that occurs when the body responds to an infection. Sepsis is treated in the hospital with IV antibiotics, fluids, and other medicines. Follow these instructions at home:  Medicines Take over-the-counter and prescription medicines only as told by your health care provider. If you were prescribed an antibiotic medicine, take it as told by your health care provider. Do not stop using the antibiotic even if you start to feel better. General instructions Make sure you: Empty your bladder often and completely. Do not hold urine for long periods of time. Empty your bladder after sex. Wipe from front to back after urinating or having a bowel movement if you are female. Use each tissue only one time when you wipe. Drink enough fluid to keep your urine pale yellow. Keep all follow-up visits. This is important. Contact a health   care provider if: Your symptoms do not get better after 1-2 days. Your symptoms go away and then return. Get help right away if: You have severe pain in  your back or your lower abdomen. You have a fever or chills. You have nausea or vomiting. Summary A urinary tract infection (UTI) is an infection of any part of the urinary tract, which includes the kidneys, ureters, bladder, and urethra. Most urinary tract infections are caused by bacteria in your genital area. Treatment for this condition often includes antibiotic medicines. If you were prescribed an antibiotic medicine, take it as told by your health care provider. Do not stop using the antibiotic even if you start to feel better. Keep all follow-up visits. This is important. This information is not intended to replace advice given to you by your health care provider. Make sure you discuss any questions you have with your health care provider. Document Revised: 01/20/2020 Document Reviewed: 01/20/2020 Elsevier Patient Education  2023 Elsevier Inc.  

## 2022-04-01 NOTE — Progress Notes (Signed)
04/01/2022 8:48 AM   [name] May 25, 1966 [id number]  Referring provider: Ignatius Specking, MD [address] [place],  [place] [address]  Followup chronic cystitis   HPI: [name] is a 56yo here for followup for chronic cystitis. She is currently on macrodantin prophylaxis. No infections since last visit. No significant LUTS. No other complaints today   PMH: Past Medical History:  Diagnosis Date   Anxiety    Bladder neck stricture    Depression    GERD (gastroesophageal reflux disease)    Headache(784.0)     Surgical History: Past Surgical History:  Procedure Laterality Date   BLADDER NECK RECONSTRUCTION     BLADDER SUSPENSION  02/11/2011   Procedure: TRANSVAGINAL TAPE (TVT) PROCEDURE;  Surgeon: Melony Overly;  Location: [facility];  Service: Gynecology;  Laterality: N/A;   btl     COLONOSCOPY     CYSTOSCOPY  02/11/2011   Procedure: CYSTOSCOPY;  Surgeon: Melony Overly;  Location: [facility];  Service: Gynecology;  Laterality: N/A;   svd      x 2   TUBAL LIGATION     VAGINAL HYSTERECTOMY  02/11/2011   Procedure: HYSTERECTOMY VAGINAL;  Surgeon: Melony Overly;  Location: [facility];  Service: Gynecology;  Laterality: N/A;    Home Medications:  Allergies as of 04/01/2022   No Known Allergies      Medication List        Accurate as of April 01, 2022  8:48 AM. If you have any questions, ask your nurse or doctor.          buPROPion 300 MG 24 hr tablet Commonly known as: WELLBUTRIN XL bupropion HCl XL 300 mg 24 hr tablet, extended release  TAKE ONE TABLET BY MOUTH DAILY   celecoxib 200 MG capsule Commonly known as: CELEBREX celecoxib 200 mg capsule  TAKE ONE CAPSULE BY MOUTH EVERY DAY   Cholecalciferol 1.25 MG (50000 UT) capsule cholecalciferol (vitamin D3) 1,250 mcg (50,000 unit) capsule  TAKE ONE CAPSULE BY MOUTH ONCE WEEKLY   dexlansoprazole 60 MG capsule Commonly known as: DEXILANT Take 60 mg by mouth daily.   estradiol 0.5 MG tablet Commonly known as:  ESTRACE estradiol 0.5 mg tablet  TAKE ONE TABLET BY MOUTH EVERY DAY   nitrofurantoin 50 MG capsule Commonly known as: MACRODANTIN Take 1 capsule (50 mg total) by mouth at bedtime.   rosuvastatin 20 MG tablet Commonly known as: CRESTOR Take 20 mg by mouth daily.        Allergies: No Known Allergies  Family History: Family History  Problem Relation Age of Onset   Anesthesia problems Mother     Social History:  reports that she has never smoked. She has never used smokeless tobacco. She reports that she does not drink alcohol and does not use drugs.  ROS: All other review of systems were reviewed and are negative except what is noted above in HPI  Physical Exam: BP 129/82   Pulse 81   LMP 01/28/2011   Constitutional:  Alert and oriented, No acute distress. HEENT: [place] AT, moist mucus membranes.  Trachea midline, no masses. Cardiovascular: No clubbing, cyanosis, or edema. Respiratory: Normal respiratory effort, no increased work of breathing. GI: Abdomen is soft, nontender, nondistended, no abdominal masses GU: No CVA tenderness.  Lymph: No cervical or inguinal lymphadenopathy. Skin: No rashes, bruises or suspicious lesions. Neurologic: Grossly intact, no focal deficits, moving all 4 extremities. Psychiatric: Normal mood and affect.  Laboratory Data: Lab Results  Component  Value Date   WBC 6.5 12/17/2021   HGB 13.9 12/17/2021   HCT 43.8 12/17/2021   MCV 82.3 12/17/2021   PLT 303 12/17/2021    Lab Results  Component Value Date   CREATININE 0.90 06/14/2021    No results found for: "PSA"  No results found for: "TESTOSTERONE"  No results found for: "HGBA1C"  Urinalysis    Component Value Date/Time   COLORURINE YELLOW 09/16/2021 1456   APPEARANCEUR Clear 09/25/2021 1510   LABSPEC 1.018 09/16/2021 1456   PHURINE 6.0 09/16/2021 1456   GLUCOSEU Negative 09/25/2021 1510   HGBUR NEGATIVE 09/16/2021 1456   BILIRUBINUR Negative 09/25/2021 1510   KETONESUR  NEGATIVE 09/16/2021 1456   PROTEINUR Negative 09/25/2021 1510   PROTEINUR NEGATIVE 09/16/2021 1456   UROBILINOGEN 0.2 01/31/2011 1030   NITRITE Negative 09/25/2021 1510   NITRITE NEGATIVE 09/16/2021 1456   LEUKOCYTESUR Negative 09/25/2021 1510   LEUKOCYTESUR NEGATIVE 09/16/2021 1456    Lab Results  Component Value Date   LABMICR Comment 09/25/2021   BACTERIA FEW (A) 09/16/2021    Pertinent Imaging:  No results found for this or any previous visit.  No results found for this or any previous visit.  No results found for this or any previous visit.  No results found for this or any previous visit.  Results for orders placed during the hospital encounter of 10/25/10  US Renal  Narrative *RADIOLOGY REPORT*  Clinical Data: Acute urinary retention  RENAL/URINARY TRACT ULTRASOUND COMPLETE  Comparison:  None  Findings:  Right Kidney:  10.6 cm length.  Normal cortical thickness and echogenicity.  No mass, hydronephrosis or shadowing calcification. No perinephric fluid.  Left Kidney:  10.7 cm length. Fetal lobulation of renal cortex, normal variant.  Normal cortical thickness and echogenicity.  No mass, hydronephrosis or shadowing calcification.  No perinephric fluid.  Bladder:  Bladder decompressed by Foley catheter, inadequately evaluated. Incidentally noted question minimally complicated right ovarian cyst 3.9 x 3.0 x 2.8 cm and probable fatty infiltration of liver.  IMPRESSION: Unremarkable kidneys. Urinary bladder decompressed by Foley catheter. Right ovarian cyst, question minimally complicated. Probable fatty infiltration of liver.  Original Report Authenticated By: Burnetta Sabin, M.D.  No valid procedures specified. Results for orders placed during the hospital encounter of 06/14/21  CT HEMATURIA WORKUP  Narrative CLINICAL DATA:  Recurrent chronic UTIs x6 months.  Gross hematuria.  EXAM: CT ABDOMEN AND PELVIS WITHOUT AND WITH  CONTRAST  TECHNIQUE: Multidetector CT imaging of the abdomen and pelvis was performed following the standard protocol before and following the bolus administration of intravenous contrast.  CONTRAST:  167mL OMNIPAQUE IOHEXOL 300 MG/ML  SOLN  COMPARISON:  None.  FINDINGS: Lower chest: Bibasilar atelectasis for scarring.  Hepatobiliary: No suspicious hepatic lesion. Gallbladder is unremarkable. No biliary ductal dilation.  Pancreas: No pancreatic ductal dilation or evidence of acute inflammation.  Spleen: Within normal limits.  Adrenals/Urinary Tract: Bilateral adrenal glands appear normal.  No hydronephrosis. Punctate nonobstructive right interpolar renal calculus. No left-sided nephrolithiasis. No ureteral or bladder calculi identified. Pelvic phleboliths.  Hypodense subcentimeter right interpolar renal lesion on image 35/13 is technically too small to accurately characterize but statistically likely to reflect a cyst. No solid enhancing renal mass.  The bilateral kidneys demonstrate symmetric enhancement and excretion of contrast material. No collecting system duplication. No suspicious filling defect visualized within the opacified portions of the collecting systems or ureters on delayed imaging. However, the distal right ureter is not well opacified limiting evaluation.  Urinary bladder is unremarkable  for degree of distension without asymmetric wall thickening or suspicious intraluminal filling defect identified.  Stomach/Bowel: No enteric contrast was administered. Surgical changes of probable prior gastric sleeve surgery. No pathologic dilation of small or large bowel. The appendix and terminal ileum appear normal. No suspicious colonic wall thickening or mass like lesions identified. Sigmoid colonic diverticulosis without findings of acute diverticulitis.  Vascular/Lymphatic: Aortic atherosclerosis without abdominal aortic aneurysm. No pathologically enlarged  abdominal or pelvic lymph nodes.  Reproductive: Status post hysterectomy. No adnexal masses.  Other: No significant abdominopelvic free fluid. No pneumoperitoneum.  Musculoskeletal: L5-S1 discogenic disease. No acute osseous abnormality.  IMPRESSION: 1. Punctate nonobstructive right interpolar renal calculus. No ureteral or bladder calculi identified. No hydronephrosis. 2. No solid enhancing renal mass. 3. Sigmoid colonic diverticulosis without findings of acute diverticulitis. 4.  Aortic Atherosclerosis (ICD10-I70.0).   Electronically Signed By: Maudry Mayhew M.D. On: 06/14/2021 15:37  No results found for this or any previous visit.   Assessment & Plan:    1.  Chronic cystitis with hematuria -continue macrodantin 50mg  QHS.    No follow-ups on file.  , MD  [facility] [place]

## 2022-06-18 ENCOUNTER — Inpatient Hospital Stay: Payer: 59 | Attending: Nurse Practitioner

## 2022-06-18 ENCOUNTER — Encounter: Payer: Self-pay | Admitting: Nurse Practitioner

## 2022-06-18 ENCOUNTER — Inpatient Hospital Stay: Payer: 59 | Admitting: Nurse Practitioner

## 2022-06-18 VITALS — BP 145/74 | HR 76 | Temp 98.1°F | Resp 18 | Ht 61.0 in | Wt 204.6 lb

## 2022-06-18 DIAGNOSIS — D509 Iron deficiency anemia, unspecified: Secondary | ICD-10-CM

## 2022-06-18 DIAGNOSIS — F32A Depression, unspecified: Secondary | ICD-10-CM | POA: Insufficient documentation

## 2022-06-18 DIAGNOSIS — K219 Gastro-esophageal reflux disease without esophagitis: Secondary | ICD-10-CM | POA: Insufficient documentation

## 2022-06-18 DIAGNOSIS — E7800 Pure hypercholesterolemia, unspecified: Secondary | ICD-10-CM | POA: Insufficient documentation

## 2022-06-18 DIAGNOSIS — M1990 Unspecified osteoarthritis, unspecified site: Secondary | ICD-10-CM | POA: Diagnosis not present

## 2022-06-18 DIAGNOSIS — K2950 Unspecified chronic gastritis without bleeding: Secondary | ICD-10-CM | POA: Diagnosis not present

## 2022-06-18 DIAGNOSIS — Z9884 Bariatric surgery status: Secondary | ICD-10-CM | POA: Insufficient documentation

## 2022-06-18 LAB — CBC WITH DIFFERENTIAL (CANCER CENTER ONLY)
Abs Immature Granulocytes: 0.02 10*3/uL (ref 0.00–0.07)
Basophils Absolute: 0.1 10*3/uL (ref 0.0–0.1)
Basophils Relative: 1 %
Eosinophils Absolute: 0.2 10*3/uL (ref 0.0–0.5)
Eosinophils Relative: 3 %
HCT: 44.1 % (ref 36.0–46.0)
Hemoglobin: 14.4 g/dL (ref 12.0–15.0)
Immature Granulocytes: 0 %
Lymphocytes Relative: 38 %
Lymphs Abs: 3.1 10*3/uL (ref 0.7–4.0)
MCH: 27.9 pg (ref 26.0–34.0)
MCHC: 32.7 g/dL (ref 30.0–36.0)
MCV: 85.3 fL (ref 80.0–100.0)
Monocytes Absolute: 0.4 10*3/uL (ref 0.1–1.0)
Monocytes Relative: 6 %
Neutro Abs: 4.2 10*3/uL (ref 1.7–7.7)
Neutrophils Relative %: 52 %
Platelet Count: 307 10*3/uL (ref 150–400)
RBC: 5.17 MIL/uL — ABNORMAL HIGH (ref 3.87–5.11)
RDW: 13.3 % (ref 11.5–15.5)
WBC Count: 8 10*3/uL (ref 4.0–10.5)
nRBC: 0 % (ref 0.0–0.2)

## 2022-06-18 LAB — VITAMIN B12: Vitamin B-12: 959 pg/mL — ABNORMAL HIGH (ref 180–914)

## 2022-06-18 LAB — FERRITIN: Ferritin: 28 ng/mL (ref 11–307)

## 2022-06-18 NOTE — Progress Notes (Signed)
  [facility] OFFICE PROGRESS NOTE   Diagnosis:  Iron deficiency  INTERVAL HISTORY:   Ms. [name] returns as scheduled.  She denies bleeding.  She reports at least a 53-month history of dyspnea when climbing stairs.  No other symptoms.  Specifically no chest pain.  Objective:  Vital signs in last 24 hours:  Blood pressure (!) 145/74, pulse 76, temperature 98.1 F (36.7 C), temperature source Oral, resp. rate 18, height 5\' 1"  (1.549 m), weight 204 lb 9.6 oz (92.8 kg), last menstrual period 01/28/2011, SpO2 98 %.    HEENT: No thrush or ulcers. Resp: Lungs clear bilaterally. Cardio: Regular rate and rhythm. GI: Abdomen soft and nontender.  No hepatosplenomegaly. Vascular: No leg edema.   Lab Results:  Lab Results  Component Value Date   WBC 8.0 06/18/2022   HGB 14.4 06/18/2022   HCT 44.1 06/18/2022   MCV 85.3 06/18/2022   PLT 307 06/18/2022   NEUTROABS 4.2 06/18/2022    Imaging:  No results found.  Medications: I have reviewed the patient's current medications.  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  Hemoglobin and MCV are both higher in the normal range.  We will follow-up on the ferritin from today.  She will discuss the exertional dyspnea with her PCP at an upcoming visit.  She will return for lab and follow-up here in 6 months.    Jenean Lindau ANP/GNP-BC   06/18/2022  8:09 AM

## 2022-12-09 NOTE — Progress Notes (Unsigned)
   [facility]GRESS [facility]   Diagnosis: Iron deficiency  INTERVAL HISTORY:   [name] returns as scheduled.  No bleeding.  Good appetite.  She reports intentional weight loss by changing her diet.  She has exertional dyspnea.  She has malaise.  She feels her iron may be low.  She has been evaluated by her primary provider for the dyspnea.  Objective:  Vital signs in last 24 hours:  Blood pressure 128/68, pulse 71, temperature 97.9 F (36.6 C), resp. rate 18, height 5\' 1"  (1.549 m), weight 190 lb 8 oz (86.4 kg), last menstrual period 01/28/2011, SpO2 100 %.    Resp: Lungs clear bilaterally Cardio: Regular rate and rhythm GI: No hepatosplenomegaly Vascular: No leg edema   Lab Results:  Lab Results  Component Value Date   WBC 7.7 12/10/2022   HGB 15.0 12/10/2022   HCT 45.8 12/10/2022   MCV 86.9 12/10/2022   PLT 302 12/10/2022   NEUTROABS 3.8 12/10/2022    CMP  Lab Results  Component Value Date   NA 141 04/24/2021   K 5.1 04/24/2021   CL 102 04/24/2021   CO2 25 04/24/2021   GLUCOSE 101 (H) 04/24/2021   BUN 10 04/24/2021   CREATININE 0.90 06/14/2021   CALCIUM 9.6 04/24/2021   GFRNONAA >60 02/12/2011   GFRAA >60 02/12/2011     Medications: I have reviewed the patient's current medications.   Assessment/Plan: Iron deficiency anemia Upper endoscopy 11/14/2021-sleeve gastrectomy with inflammation and erythema, biopsy reactive gastropathy with mild chronic gastritis Colonoscopy 11/14/2021-diverticulosis in the sigmoid colon Venofer 09/27/2021, 10/04/2021 History of thrombocytosis History of elevated lymphocytes History of gastric sleeve surgery 2016 Arthritis Hypercholesterolemia Depression Reflux    Disposition: [name] has a history of iron deficiency anemia, likely secondary to bariatric surgery.  She received IV iron in April 2023 and reports feeling better after the iron infusion.  The ferritin was low in March 2023.  The ferritin  is in the normal range today.  She is not anemic.  She will return for a lab visit in 3 months and an office visit in 6 months.  Thornton Papas, MD  12/10/2022  12:24 PM

## 2022-12-10 ENCOUNTER — Inpatient Hospital Stay: Payer: 59 | Attending: Oncology

## 2022-12-10 ENCOUNTER — Encounter: Payer: Self-pay | Admitting: Oncology

## 2022-12-10 ENCOUNTER — Telehealth: Payer: Self-pay

## 2022-12-10 ENCOUNTER — Inpatient Hospital Stay: Payer: 59 | Admitting: Oncology

## 2022-12-10 VITALS — BP 128/68 | HR 71 | Temp 97.9°F | Resp 18 | Ht 61.0 in | Wt 190.5 lb

## 2022-12-10 DIAGNOSIS — E7800 Pure hypercholesterolemia, unspecified: Secondary | ICD-10-CM | POA: Insufficient documentation

## 2022-12-10 DIAGNOSIS — R5383 Other fatigue: Secondary | ICD-10-CM | POA: Diagnosis not present

## 2022-12-10 DIAGNOSIS — R0602 Shortness of breath: Secondary | ICD-10-CM | POA: Insufficient documentation

## 2022-12-10 DIAGNOSIS — D509 Iron deficiency anemia, unspecified: Secondary | ICD-10-CM | POA: Insufficient documentation

## 2022-12-10 DIAGNOSIS — Z9884 Bariatric surgery status: Secondary | ICD-10-CM | POA: Insufficient documentation

## 2022-12-10 DIAGNOSIS — Z79899 Other long term (current) drug therapy: Secondary | ICD-10-CM | POA: Insufficient documentation

## 2022-12-10 DIAGNOSIS — K5730 Diverticulosis of large intestine without perforation or abscess without bleeding: Secondary | ICD-10-CM | POA: Insufficient documentation

## 2022-12-10 DIAGNOSIS — M1990 Unspecified osteoarthritis, unspecified site: Secondary | ICD-10-CM | POA: Diagnosis not present

## 2022-12-10 DIAGNOSIS — K219 Gastro-esophageal reflux disease without esophagitis: Secondary | ICD-10-CM | POA: Insufficient documentation

## 2022-12-10 LAB — CBC WITH DIFFERENTIAL (CANCER CENTER ONLY)
Abs Immature Granulocytes: 0.02 10*3/uL (ref 0.00–0.07)
Basophils Absolute: 0 10*3/uL (ref 0.0–0.1)
Basophils Relative: 1 %
Eosinophils Absolute: 0.3 10*3/uL (ref 0.0–0.5)
Eosinophils Relative: 4 %
HCT: 45.8 % (ref 36.0–46.0)
Hemoglobin: 15 g/dL (ref 12.0–15.0)
Immature Granulocytes: 0 %
Lymphocytes Relative: 40 %
Lymphs Abs: 3.1 10*3/uL (ref 0.7–4.0)
MCH: 28.5 pg (ref 26.0–34.0)
MCHC: 32.8 g/dL (ref 30.0–36.0)
MCV: 86.9 fL (ref 80.0–100.0)
Monocytes Absolute: 0.5 10*3/uL (ref 0.1–1.0)
Monocytes Relative: 6 %
Neutro Abs: 3.8 10*3/uL (ref 1.7–7.7)
Neutrophils Relative %: 49 %
Platelet Count: 302 10*3/uL (ref 150–400)
RBC: 5.27 MIL/uL — ABNORMAL HIGH (ref 3.87–5.11)
RDW: 14.2 % (ref 11.5–15.5)
WBC Count: 7.7 10*3/uL (ref 4.0–10.5)
nRBC: 0 % (ref 0.0–0.2)

## 2022-12-10 LAB — FERRITIN: Ferritin: 70 ng/mL (ref 11–307)

## 2022-12-10 NOTE — Telephone Encounter (Signed)
I contacted the patient to inform her that her ferritin levels are within the normal range and to remind her to follow up as scheduled.

## 2023-03-12 ENCOUNTER — Inpatient Hospital Stay: Payer: 59 | Attending: Oncology

## 2023-03-12 DIAGNOSIS — D509 Iron deficiency anemia, unspecified: Secondary | ICD-10-CM | POA: Insufficient documentation

## 2023-03-12 LAB — CBC WITH DIFFERENTIAL (CANCER CENTER ONLY)
Abs Immature Granulocytes: 0.01 10*3/uL (ref 0.00–0.07)
Basophils Absolute: 0 10*3/uL (ref 0.0–0.1)
Basophils Relative: 1 %
Eosinophils Absolute: 0.2 10*3/uL (ref 0.0–0.5)
Eosinophils Relative: 2 %
HCT: 45.4 % (ref 36.0–46.0)
Hemoglobin: 14.9 g/dL (ref 12.0–15.0)
Immature Granulocytes: 0 %
Lymphocytes Relative: 45 %
Lymphs Abs: 3.2 10*3/uL (ref 0.7–4.0)
MCH: 29.3 pg (ref 26.0–34.0)
MCHC: 32.8 g/dL (ref 30.0–36.0)
MCV: 89.2 fL (ref 80.0–100.0)
Monocytes Absolute: 0.5 10*3/uL (ref 0.1–1.0)
Monocytes Relative: 7 %
Neutro Abs: 3.1 10*3/uL (ref 1.7–7.7)
Neutrophils Relative %: 45 %
Platelet Count: 325 10*3/uL (ref 150–400)
RBC: 5.09 MIL/uL (ref 3.87–5.11)
RDW: 13.1 % (ref 11.5–15.5)
WBC Count: 7 10*3/uL (ref 4.0–10.5)
nRBC: 0 % (ref 0.0–0.2)

## 2023-03-12 LAB — FERRITIN: Ferritin: 99 ng/mL (ref 11–307)

## 2023-04-08 ENCOUNTER — Ambulatory Visit: Payer: 59 | Admitting: Urology

## 2023-04-08 ENCOUNTER — Encounter: Payer: Self-pay | Admitting: Urology

## 2023-04-08 VITALS — BP 100/66 | HR 64

## 2023-04-08 DIAGNOSIS — Z87440 Personal history of urinary (tract) infections: Secondary | ICD-10-CM | POA: Diagnosis not present

## 2023-04-08 DIAGNOSIS — Z09 Encounter for follow-up examination after completed treatment for conditions other than malignant neoplasm: Secondary | ICD-10-CM

## 2023-04-08 DIAGNOSIS — N3021 Other chronic cystitis with hematuria: Secondary | ICD-10-CM

## 2023-04-08 LAB — URINALYSIS, ROUTINE W REFLEX MICROSCOPIC
Bilirubin, UA: NEGATIVE
Glucose, UA: NEGATIVE
Ketones, UA: NEGATIVE
Leukocytes,UA: NEGATIVE
Nitrite, UA: NEGATIVE
RBC, UA: NEGATIVE
Specific Gravity, UA: 1.03 (ref 1.005–1.030)
Urobilinogen, Ur: 1 mg/dL (ref 0.2–1.0)
pH, UA: 6 (ref 5.0–7.5)

## 2023-04-08 MED ORDER — NITROFURANTOIN MACROCRYSTAL 50 MG PO CAPS
50.0000 mg | ORAL_CAPSULE | Freq: Every day | ORAL | 3 refills | Status: DC
Start: 2023-04-08 — End: 2024-05-03

## 2023-04-08 NOTE — Progress Notes (Signed)
04/08/2023 9:32 AM   [name] [name] Mar 27, 1966 [id number]  Referring provider: Ignatius Specking, MD [address] [place],  [place] [address]  Followup recurrent UTI   HPI: [name] is a 57yo here for followup for recurrent UTI. No UTIs since last visit. She is on macrodantin prophylaxis.She has urinary urgency after she hold her urine for 5-6 hours. She has urge incontinence at that time. Urine stream strong.    PMH: Past Medical History:  Diagnosis Date   Anxiety    Bladder neck stricture    Depression    GERD (gastroesophageal reflux disease)    Headache(784.0)     Surgical History: Past Surgical History:  Procedure Laterality Date   BLADDER NECK RECONSTRUCTION     BLADDER SUSPENSION  02/11/2011   Procedure: TRANSVAGINAL TAPE (TVT) PROCEDURE;  Surgeon: Melony Overly;  Location: [facility];  Service: Gynecology;  Laterality: N/A;   btl     COLONOSCOPY     CYSTOSCOPY  02/11/2011   Procedure: CYSTOSCOPY;  Surgeon: Melony Overly;  Location: [facility];  Service: Gynecology;  Laterality: N/A;   svd      x 2   TUBAL LIGATION     VAGINAL HYSTERECTOMY  02/11/2011   Procedure: HYSTERECTOMY VAGINAL;  Surgeon: Melony Overly;  Location: [facility];  Service: Gynecology;  Laterality: N/A;    Home Medications:  Allergies as of 04/08/2023   No Known Allergies      Medication List        Accurate as of April 08, 2023  9:32 AM. If you have any questions, ask your nurse or doctor.          buPROPion 300 MG 24 hr tablet Commonly known as: WELLBUTRIN XL bupropion HCl XL 300 mg 24 hr tablet, extended release  TAKE ONE TABLET BY MOUTH DAILY   celecoxib 200 MG capsule Commonly known as: CELEBREX celecoxib 200 mg capsule  TAKE ONE CAPSULE BY MOUTH EVERY DAY   Cholecalciferol 1.25 MG (50000 UT) capsule cholecalciferol (vitamin D3) 1,250 mcg (50,000 unit) capsule  TAKE ONE CAPSULE BY MOUTH ONCE WEEKLY   estradiol 0.5 MG tablet Commonly known as: ESTRACE estradiol 0.5 mg tablet   TAKE ONE TABLET BY MOUTH EVERY DAY   nitrofurantoin 50 MG capsule Commonly known as: MACRODANTIN Take 1 capsule (50 mg total) by mouth at bedtime.   omeprazole 40 MG capsule Commonly known as: PRILOSEC Take 40 mg by mouth daily.   rosuvastatin 20 MG tablet Commonly known as: CRESTOR Take 20 mg by mouth daily.        Allergies: No Known Allergies  Family History: Family History  Problem Relation Age of Onset   Anesthesia problems Mother     Social History:  reports that she has never smoked. She has never used smokeless tobacco. She reports that she does not drink alcohol and does not use drugs.  ROS: All other review of systems were reviewed and are negative except what is noted above in HPI  Physical Exam: BP 100/66   Pulse 64   LMP 01/28/2011   Constitutional:  Alert and oriented, No acute distress. HEENT: [place] AT, moist mucus membranes.  Trachea midline, no masses. Cardiovascular: No clubbing, cyanosis, or edema. Respiratory: Normal respiratory effort, no increased work of breathing. GI: Abdomen is soft, nontender, nondistended, no abdominal masses GU: No CVA tenderness.  Lymph: No cervical or inguinal lymphadenopathy. Skin: No rashes, bruises or suspicious lesions. Neurologic: Grossly intact, no focal deficits, moving all  4 extremities. Psychiatric: Normal mood and affect.  Laboratory Data: Lab Results  Component Value Date   WBC 7.0 03/12/2023   HGB 14.9 03/12/2023   HCT 45.4 03/12/2023   MCV 89.2 03/12/2023   PLT 325 03/12/2023    Lab Results  Component Value Date   CREATININE 0.90 06/14/2021    No results found for: "PSA"  No results found for: "TESTOSTERONE"  No results found for: "HGBA1C"  Urinalysis    Component Value Date/Time   COLORURINE YELLOW 09/16/2021 1456   APPEARANCEUR Clear 04/01/2022 0925   LABSPEC 1.018 09/16/2021 1456   PHURINE 6.0 09/16/2021 1456   GLUCOSEU Negative 04/01/2022 0925   HGBUR NEGATIVE 09/16/2021 1456    BILIRUBINUR Negative 04/01/2022 0925   KETONESUR NEGATIVE 09/16/2021 1456   PROTEINUR Negative 04/01/2022 0925   PROTEINUR NEGATIVE 09/16/2021 1456   UROBILINOGEN 0.2 01/31/2011 1030   NITRITE Negative 04/01/2022 0925   NITRITE NEGATIVE 09/16/2021 1456   LEUKOCYTESUR Negative 04/01/2022 0925   LEUKOCYTESUR NEGATIVE 09/16/2021 1456    Lab Results  Component Value Date   LABMICR See below: 04/01/2022   WBCUA 0-5 04/01/2022   LABEPIT 0-10 04/01/2022   MUCUS Present (A) 04/01/2022   BACTERIA Many (A) 04/01/2022    Pertinent Imaging:  No results found for this or any previous visit.  No results found for this or any previous visit.  No results found for this or any previous visit.  No results found for this or any previous visit.  Results for orders placed during the hospital encounter of 10/25/10  US Renal  Narrative *RADIOLOGY REPORT*  Clinical Data: Acute urinary retention  RENAL/URINARY TRACT ULTRASOUND COMPLETE  Comparison:  None  Findings:  Right Kidney:  10.6 cm length.  Normal cortical thickness and echogenicity.  No mass, hydronephrosis or shadowing calcification. No perinephric fluid.  Left Kidney:  10.7 cm length. Fetal lobulation of renal cortex, normal variant.  Normal cortical thickness and echogenicity.  No mass, hydronephrosis or shadowing calcification.  No perinephric fluid.  Bladder:  Bladder decompressed by Foley catheter, inadequately evaluated. Incidentally noted question minimally complicated right ovarian cyst 3.9 x 3.0 x 2.8 cm and probable fatty infiltration of liver.  IMPRESSION: Unremarkable kidneys. Urinary bladder decompressed by Foley catheter. Right ovarian cyst, question minimally complicated. Probable fatty infiltration of liver.  Original Report Authenticated By: Lollie Marrow, M.D.  No valid procedures specified. Results for orders placed during the hospital encounter of 06/14/21  CT HEMATURIA  WORKUP  Narrative CLINICAL DATA:  Recurrent chronic UTIs x6 months.  Gross hematuria.  EXAM: CT ABDOMEN AND PELVIS WITHOUT AND WITH CONTRAST  TECHNIQUE: Multidetector CT imaging of the abdomen and pelvis was performed following the standard protocol before and following the bolus administration of intravenous contrast.  CONTRAST:  OMNIPAQUE IOHEXOL 300 MG/ML  SOLN  COMPARISON:  None.  FINDINGS: Lower chest: Bibasilar atelectasis for scarring.  Hepatobiliary: No suspicious hepatic lesion. Gallbladder is unremarkable. No biliary ductal dilation.  Pancreas: No pancreatic ductal dilation or evidence of acute inflammation.  Spleen: Within normal limits.  Adrenals/Urinary Tract: Bilateral adrenal glands appear normal.  No hydronephrosis. Punctate nonobstructive right interpolar renal calculus. No left-sided nephrolithiasis. No ureteral or bladder calculi identified. Pelvic phleboliths.  Hypodense subcentimeter right interpolar renal lesion on image 35/13 is technically too small to accurately characterize but statistically likely to reflect a cyst. No solid enhancing renal mass.  The bilateral kidneys demonstrate symmetric enhancement and excretion of contrast material. No collecting system duplication. No suspicious filling  defect visualized within the opacified portions of the collecting systems or ureters on delayed imaging. However, the distal right ureter is not well opacified limiting evaluation.  Urinary bladder is unremarkable for degree of distension without asymmetric wall thickening or suspicious intraluminal filling defect identified.  Stomach/Bowel: No enteric contrast was administered. Surgical changes of probable prior gastric sleeve surgery. No pathologic dilation of small or large bowel. The appendix and terminal ileum appear normal. No suspicious colonic wall thickening or mass like lesions identified. Sigmoid colonic diverticulosis without  findings of acute diverticulitis.  Vascular/Lymphatic: Aortic atherosclerosis without abdominal aortic aneurysm. No pathologically enlarged abdominal or pelvic lymph nodes.  Reproductive: Status post hysterectomy. No adnexal masses.  Other: No significant abdominopelvic free fluid. No pneumoperitoneum.  Musculoskeletal: L5-S1 discogenic disease. No acute osseous abnormality.  IMPRESSION: 1. Punctate nonobstructive right interpolar renal calculus. No ureteral or bladder calculi identified. No hydronephrosis. 2. No solid enhancing renal mass. 3. Sigmoid colonic diverticulosis without findings of acute diverticulitis. 4.  Aortic Atherosclerosis (ICD10-I70.0).   Electronically Signed By: Maudry Mayhew M.D. On: 06/14/2021 15:37  No results found for this or any previous visit.   Assessment & Plan:    1. Chronic cystitis with hematuria -continue macrodantin prophylaxis - Urinalysis, Routine w reflex microscopic   No follow-ups on file.  Wilkie Aye, MD  [facility] [place]

## 2023-04-08 NOTE — Patient Instructions (Signed)
Urinary Tract Infection, Adult  A urinary tract infection (UTI) is an infection of any part of the urinary tract. The urinary tract includes the kidneys, ureters, bladder, and urethra. These organs make, store, and get rid of urine in the body. An upper UTI affects the ureters and kidneys. A lower UTI affects the bladder and urethra. What are the causes? Most urinary tract infections are caused by bacteria in your genital area around your urethra, where urine leaves your body. These bacteria grow and cause inflammation of your urinary tract. What increases the risk? You are more likely to develop this condition if: You have a urinary catheter that stays in place. You are not able to control when you urinate or have a bowel movement (incontinence). You are female and you: Use a spermicide or diaphragm for birth control. Have low estrogen levels. Are pregnant. You have certain genes that increase your risk. You are sexually active. You take antibiotic medicines. You have a condition that causes your flow of urine to slow down, such as: An enlarged prostate, if you are female. Blockage in your urethra. A kidney stone. A nerve condition that affects your bladder control (neurogenic bladder). Not getting enough to drink, or not urinating often. You have certain medical conditions, such as: Diabetes. A weak disease-fighting system (immunesystem). Sickle cell disease. Gout. Spinal cord injury. What are the signs or symptoms? Symptoms of this condition include: Needing to urinate right away (urgency). Frequent urination. This may include small amounts of urine each time you urinate. Pain or burning with urination. Blood in the urine. Urine that smells bad or unusual. Trouble urinating. Cloudy urine. Vaginal discharge, if you are female. Pain in the abdomen or the lower back. You may also have: Vomiting or a decreased appetite. Confusion. Irritability or tiredness. A fever or  chills. Diarrhea. The first symptom in older adults may be confusion. In some cases, they may not have any symptoms until the infection has worsened. How is this diagnosed? This condition is diagnosed based on your medical history and a physical exam. You may also have other tests, including: Urine tests. Blood tests. Tests for STIs (sexually transmitted infections). If you have had more than one UTI, a cystoscopy or imaging studies may be done to determine the cause of the infections. How is this treated? Treatment for this condition includes: Antibiotic medicine. Over-the-counter medicines to treat discomfort. Drinking enough water to stay hydrated. If you have frequent infections or have other conditions such as a kidney stone, you may need to see a health care provider who specializes in the urinary tract (urologist). In rare cases, urinary tract infections can cause sepsis. Sepsis is a life-threatening condition that occurs when the body responds to an infection. Sepsis is treated in the hospital with IV antibiotics, fluids, and other medicines. Follow these instructions at home:  Medicines Take over-the-counter and prescription medicines only as told by your health care provider. If you were prescribed an antibiotic medicine, take it as told by your health care provider. Do not stop using the antibiotic even if you start to feel better. General instructions Make sure you: Empty your bladder often and completely. Do not hold urine for long periods of time. Empty your bladder after sex. Wipe from front to back after urinating or having a bowel movement if you are female. Use each tissue only one time when you wipe. Drink enough fluid to keep your urine pale yellow. Keep all follow-up visits. This is important. Contact a health  care provider if: Your symptoms do not get better after 1-2 days. Your symptoms go away and then return. Get help right away if: You have severe pain in  your back or your lower abdomen. You have a fever or chills. You have nausea or vomiting. Summary A urinary tract infection (UTI) is an infection of any part of the urinary tract, which includes the kidneys, ureters, bladder, and urethra. Most urinary tract infections are caused by bacteria in your genital area. Treatment for this condition often includes antibiotic medicines. If you were prescribed an antibiotic medicine, take it as told by your health care provider. Do not stop using the antibiotic even if you start to feel better. Keep all follow-up visits. This is important. This information is not intended to replace advice given to you by your health care provider. Make sure you discuss any questions you have with your health care provider. Document Revised: 01/15/2020 Document Reviewed: 01/20/2020 Elsevier Patient Education  2024 [employer].

## 2023-04-11 IMAGING — CT CT ABD-PEL WO/W CM
3 of 12 series · 11 of 46 positions shown, 17 images · IV contrast (Omnipaque or Isovue)
Comparison: None.

CLINICAL DATA: Recurrent chronic UTIs x6 months.  Gross hematuria.

EXAM:
CT ABDOMEN AND PELVIS WITHOUT AND WITH CONTRAST
TECHNIQUE: Multidetector CT imaging of the abdomen and pelvis was performed
following the standard protocol before and following the bolus
administration of intravenous contrast.
CONTRAST:  125mL OMNIPAQUE IOHEXOL 300 MG/ML  SOLN

[Series 2: axial pre · axial · non-contrast · 0.95mm/px · z∈[+725,+980]mm · 4 of 86 slices shown]
[im 18/86  soft-tissue]
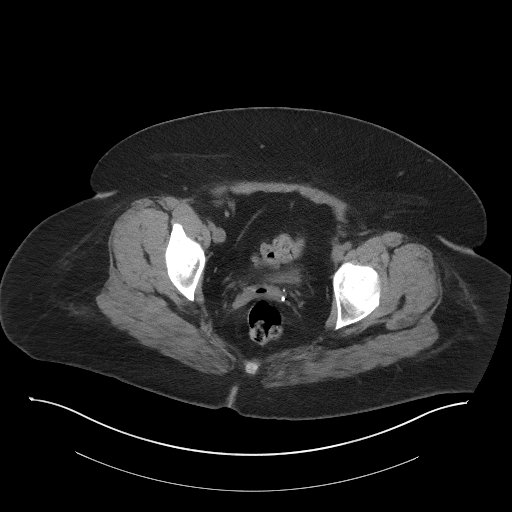
[im 35/86  soft-tissue]
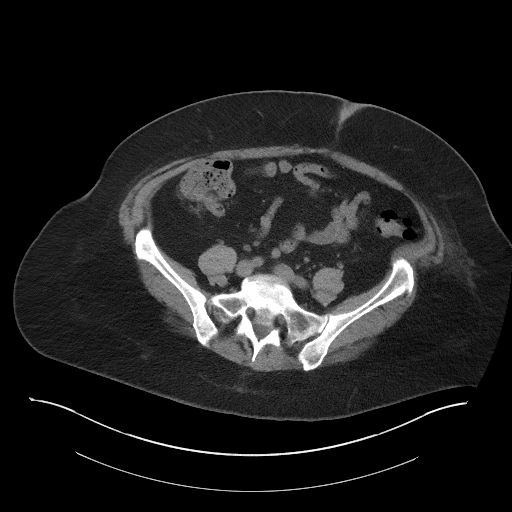
[im 52/86  soft-tissue]
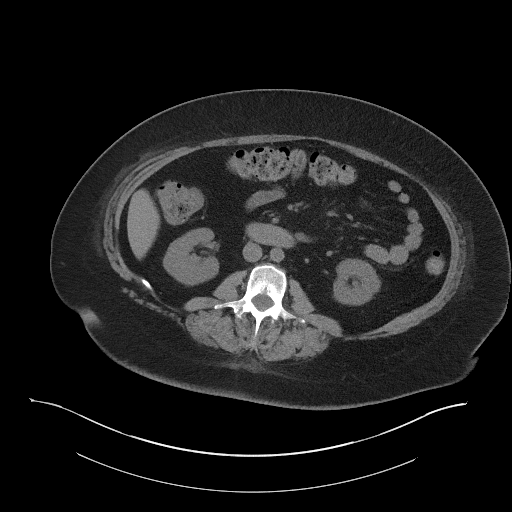
[im 69/86  soft-tissue]
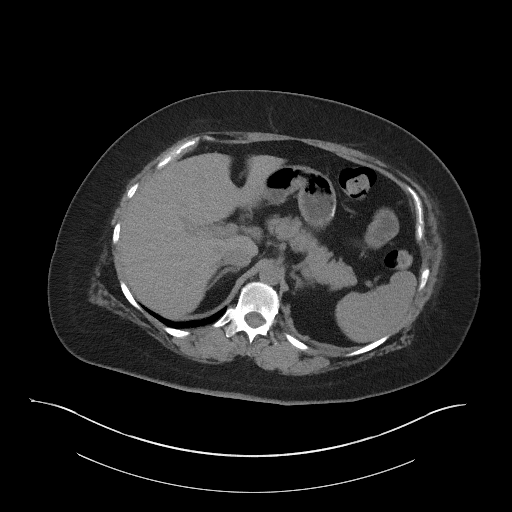

[Series 5: coronal pre · coronal · non-contrast · 0.86mm/px · 2 of 107 slices shown, 3 images]
[im 36/107  soft-tissue]
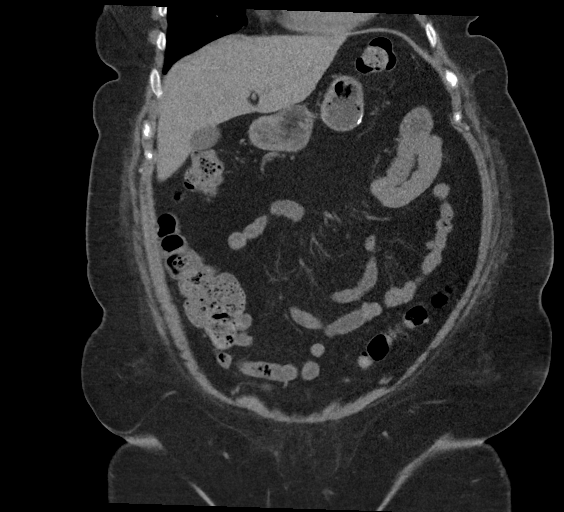
[im 36/107  bone]
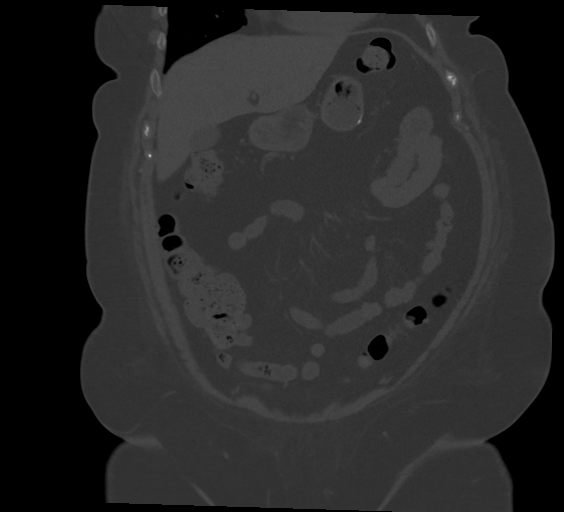
[im 71/107  soft-tissue]
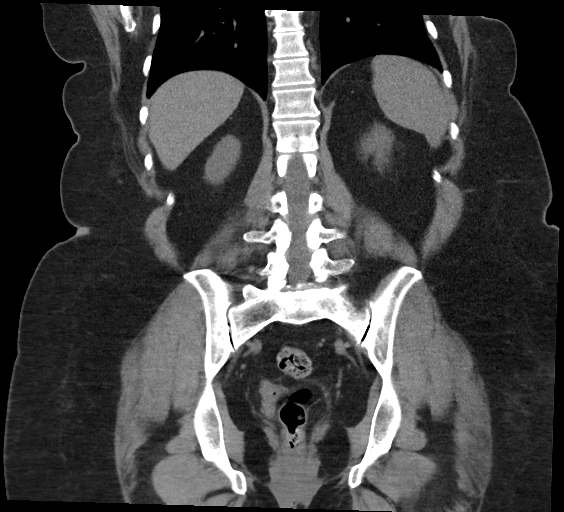

[Series 13: axial delay · axial · delayed · 0.88mm/px · z∈[+714,+1034]mm · 5 of 96 slices shown, 10 images]
[im 16/96  soft-tissue]
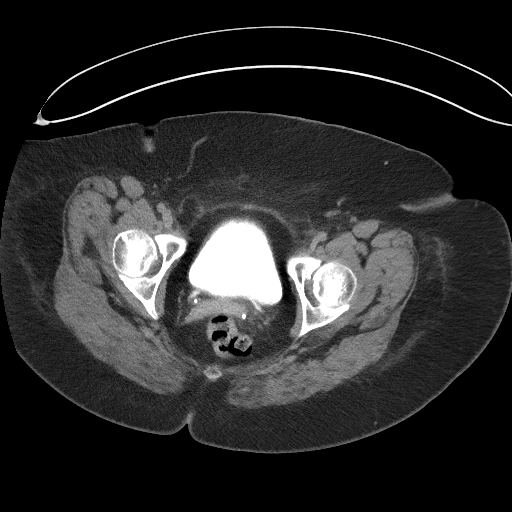
[im 16/96  bone]
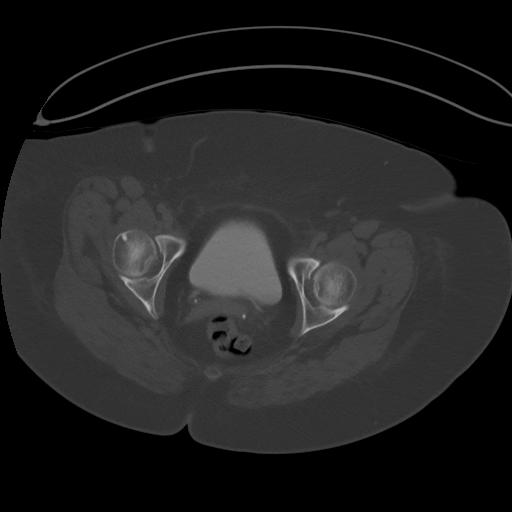
[im 32/96  soft-tissue]
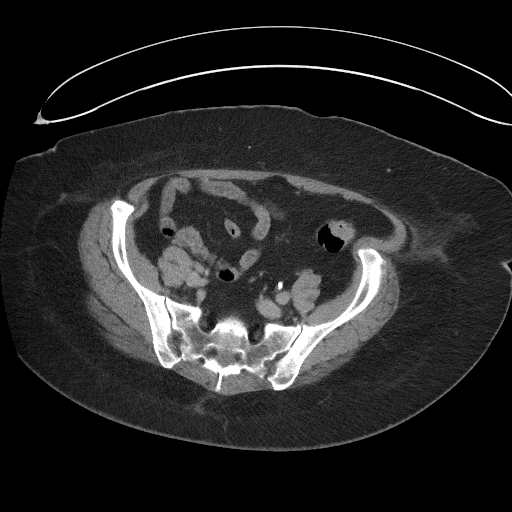
[im 32/96  lung]
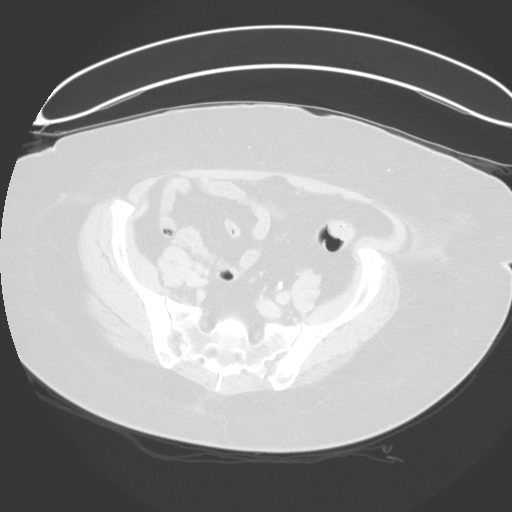
[im 48/96  soft-tissue]
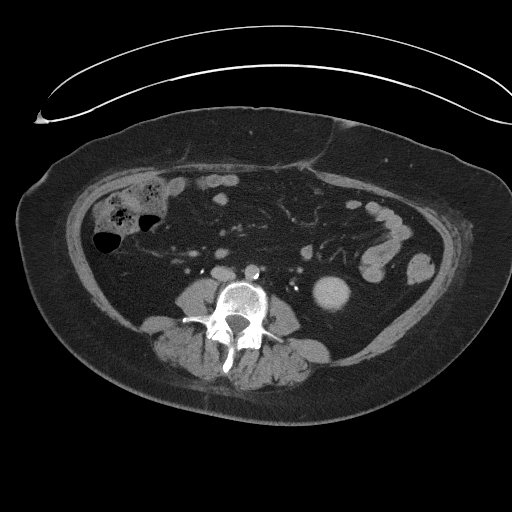
[im 48/96  lung]
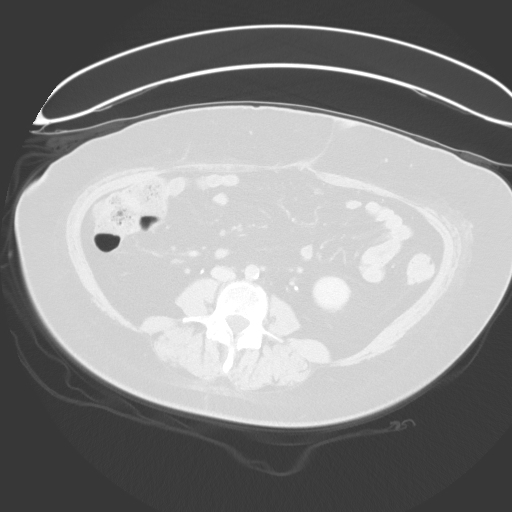
[im 64/96  soft-tissue]
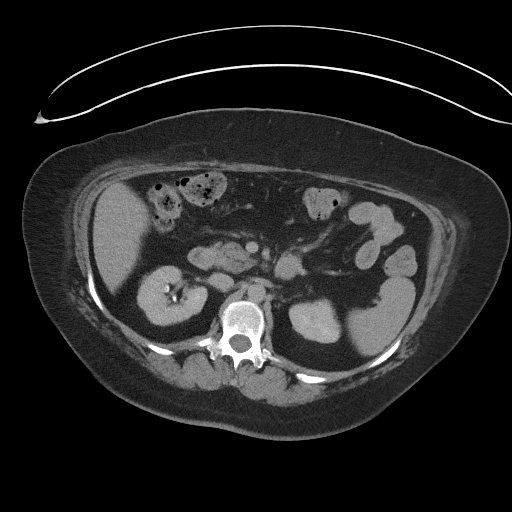
[im 64/96  lung]
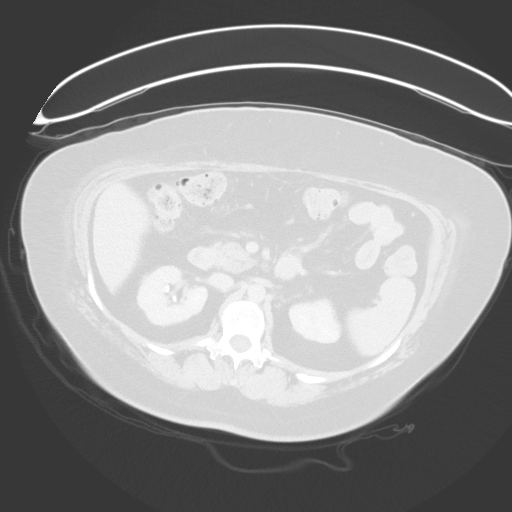
[im 80/96  soft-tissue]
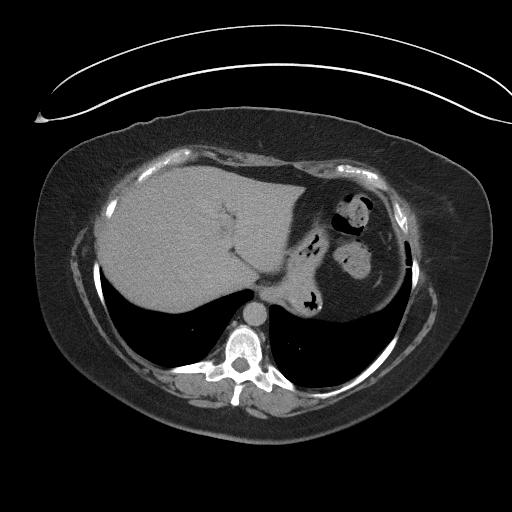
[im 80/96  lung]
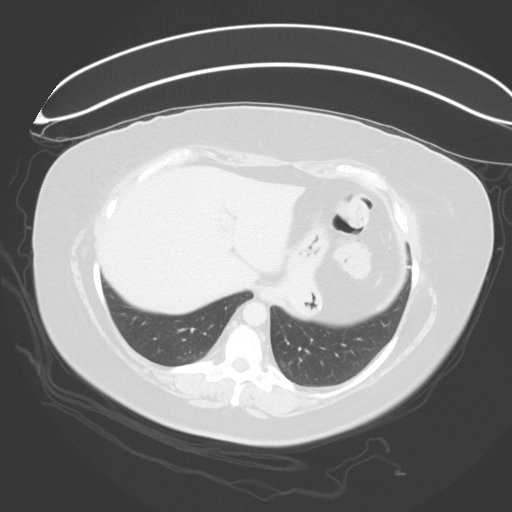

[11 of 46 positions shown; findings below may reference images not displayed]

FINDINGS: Lower chest: Bibasilar atelectasis for scarring.

Hepatobiliary: No suspicious hepatic lesion. Gallbladder is
unremarkable. No biliary ductal dilation.

Pancreas: No pancreatic ductal dilation or evidence of acute
inflammation.

Spleen: Within normal limits.

Adrenals/Urinary Tract: Bilateral adrenal glands appear normal.

No hydronephrosis. Punctate nonobstructive right interpolar renal
calculus. No left-sided nephrolithiasis. No ureteral or bladder
calculi identified. Pelvic phleboliths.

Hypodense subcentimeter right interpolar renal lesion on image 35/13
is technically too small to accurately characterize but
statistically likely to reflect a cyst. No solid enhancing renal
mass.

The bilateral kidneys demonstrate symmetric enhancement and
excretion of contrast material. No collecting system duplication. No
suspicious filling defect visualized within the opacified portions
of the collecting systems or ureters on delayed imaging. However,
the distal right ureter is not well opacified limiting evaluation.

Urinary bladder is unremarkable for degree of distension without
asymmetric wall thickening or suspicious intraluminal filling defect
identified.

Stomach/Bowel: No enteric contrast was administered. Surgical
changes of probable prior gastric sleeve surgery. No pathologic
dilation of small or large bowel. The appendix and terminal ileum
appear normal. No suspicious colonic wall thickening or mass like
lesions identified. Sigmoid colonic diverticulosis without findings
of acute diverticulitis.

Vascular/Lymphatic: Aortic atherosclerosis without abdominal aortic
aneurysm. No pathologically enlarged abdominal or pelvic lymph
nodes.

Reproductive: Status post hysterectomy. No adnexal masses.

Other: No significant abdominopelvic free fluid. No
pneumoperitoneum.

Musculoskeletal: L5-S1 discogenic disease. No acute osseous
abnormality.
IMPRESSION: 1. Punctate nonobstructive right interpolar renal calculus. No
ureteral or bladder calculi identified. No hydronephrosis.
2. No solid enhancing renal mass.
3. Sigmoid colonic diverticulosis without findings of acute
diverticulitis.
4.  Aortic Atherosclerosis ([id number]).

## 2023-06-11 ENCOUNTER — Encounter: Payer: Self-pay | Admitting: Nurse Practitioner

## 2023-06-11 ENCOUNTER — Inpatient Hospital Stay: Payer: 59 | Attending: Oncology | Admitting: Nurse Practitioner

## 2023-06-11 ENCOUNTER — Inpatient Hospital Stay: Payer: 59

## 2023-06-11 VITALS — BP 103/71 | HR 72 | Temp 98.1°F | Resp 18 | Ht 61.0 in | Wt 167.4 lb

## 2023-06-11 DIAGNOSIS — Z9884 Bariatric surgery status: Secondary | ICD-10-CM | POA: Diagnosis not present

## 2023-06-11 DIAGNOSIS — M1990 Unspecified osteoarthritis, unspecified site: Secondary | ICD-10-CM | POA: Diagnosis not present

## 2023-06-11 DIAGNOSIS — F32A Depression, unspecified: Secondary | ICD-10-CM | POA: Diagnosis not present

## 2023-06-11 DIAGNOSIS — D509 Iron deficiency anemia, unspecified: Secondary | ICD-10-CM | POA: Insufficient documentation

## 2023-06-11 DIAGNOSIS — E7800 Pure hypercholesterolemia, unspecified: Secondary | ICD-10-CM | POA: Diagnosis not present

## 2023-06-11 DIAGNOSIS — K219 Gastro-esophageal reflux disease without esophagitis: Secondary | ICD-10-CM | POA: Insufficient documentation

## 2023-06-11 LAB — CBC WITH DIFFERENTIAL (CANCER CENTER ONLY)
Abs Immature Granulocytes: 0 10*3/uL (ref 0.00–0.07)
Basophils Absolute: 0 10*3/uL (ref 0.0–0.1)
Basophils Relative: 1 %
Eosinophils Absolute: 0.2 10*3/uL (ref 0.0–0.5)
Eosinophils Relative: 3 %
HCT: 44 % (ref 36.0–46.0)
Hemoglobin: 14.6 g/dL (ref 12.0–15.0)
Immature Granulocytes: 0 %
Lymphocytes Relative: 51 %
Lymphs Abs: 2.7 10*3/uL (ref 0.7–4.0)
MCH: 29.3 pg (ref 26.0–34.0)
MCHC: 33.2 g/dL (ref 30.0–36.0)
MCV: 88.2 fL (ref 80.0–100.0)
Monocytes Absolute: 0.4 10*3/uL (ref 0.1–1.0)
Monocytes Relative: 7 %
Neutro Abs: 2 10*3/uL (ref 1.7–7.7)
Neutrophils Relative %: 38 %
Platelet Count: 256 10*3/uL (ref 150–400)
RBC: 4.99 MIL/uL (ref 3.87–5.11)
RDW: 13 % (ref 11.5–15.5)
WBC Count: 5.2 10*3/uL (ref 4.0–10.5)
nRBC: 0 % (ref 0.0–0.2)

## 2023-06-11 LAB — FERRITIN: Ferritin: 110 ng/mL (ref 11–307)

## 2023-06-11 LAB — VITAMIN B12: Vitamin B-12: 899 pg/mL (ref 180–914)

## 2023-06-11 NOTE — Progress Notes (Signed)
  [facility] OFFICE PROGRESS NOTE   Diagnosis: Iron deficiency  INTERVAL HISTORY:   [name] returns as scheduled.  Overall she feels well.  She continues to experience intermittent dyspnea on exertion when climbing stairs.  She denies bleeding.  She does not crave ice.  No tongue soreness.  She has lost weight since her last visit.  She reports this is intentional.  She has adjusted her diet and is walking more.  Objective:  Vital signs in last 24 hours:  Blood pressure 103/71, pulse 72, temperature 98.1 F (36.7 C), temperature source Temporal, resp. rate 18, height 5\' 1"  (1.549 m), weight 167 lb 6.4 oz (75.9 kg), last menstrual period 01/28/2011, SpO2 99%.     Resp: Lungs clear bilaterally. Cardio: Regular rate and rhythm. GI: No hepatosplenomegaly. Vascular: No leg edema.   Lab Results:  Lab Results  Component Value Date   WBC 5.2 06/11/2023   HGB 14.6 06/11/2023   HCT 44.0 06/11/2023   MCV 88.2 06/11/2023   PLT 256 06/11/2023   NEUTROABS 2.0 06/11/2023    Imaging:  No results found.  Medications: I have reviewed the patient's current medications.  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  Hemoglobin and MCV are in normal range.  We will follow-up on the ferritin and B12 from today.  She will return for labs in 3 months.  We will see her in follow-up in 6 months.    Lonna Cobb ANP/GNP-BC   06/11/2023  9:07 AM

## 2023-06-12 ENCOUNTER — Telehealth: Payer: Self-pay

## 2023-06-12 NOTE — Telephone Encounter (Signed)
-----   Message from Lonna Cobb sent at 06/12/2023  8:13 AM EST ----- Please let her know ferritin and B12 are both in normal range.  Follow-up as scheduled.

## 2023-06-12 NOTE — Telephone Encounter (Signed)
Patient gave verbal understanding and had no further questions or concerns  

## 2023-09-09 ENCOUNTER — Telehealth: Payer: Self-pay | Admitting: *Deleted

## 2023-09-09 ENCOUNTER — Inpatient Hospital Stay: Payer: 59 | Attending: Nurse Practitioner

## 2023-09-09 DIAGNOSIS — D509 Iron deficiency anemia, unspecified: Secondary | ICD-10-CM | POA: Diagnosis present

## 2023-09-09 LAB — CBC WITH DIFFERENTIAL (CANCER CENTER ONLY)
Abs Immature Granulocytes: 0.01 10*3/uL (ref 0.00–0.07)
Basophils Absolute: 0 10*3/uL (ref 0.0–0.1)
Basophils Relative: 1 %
Eosinophils Absolute: 0.1 10*3/uL (ref 0.0–0.5)
Eosinophils Relative: 2 %
HCT: 42.7 % (ref 36.0–46.0)
Hemoglobin: 14.1 g/dL (ref 12.0–15.0)
Immature Granulocytes: 0 %
Lymphocytes Relative: 47 %
Lymphs Abs: 3.4 10*3/uL (ref 0.7–4.0)
MCH: 29.2 pg (ref 26.0–34.0)
MCHC: 33 g/dL (ref 30.0–36.0)
MCV: 88.4 fL (ref 80.0–100.0)
Monocytes Absolute: 0.5 10*3/uL (ref 0.1–1.0)
Monocytes Relative: 7 %
Neutro Abs: 3.1 10*3/uL (ref 1.7–7.7)
Neutrophils Relative %: 43 %
Platelet Count: 259 10*3/uL (ref 150–400)
RBC: 4.83 MIL/uL (ref 3.87–5.11)
RDW: 12.6 % (ref 11.5–15.5)
WBC Count: 7.1 10*3/uL (ref 4.0–10.5)
nRBC: 0 % (ref 0.0–0.2)

## 2023-09-09 LAB — FERRITIN: Ferritin: 105 ng/mL (ref 11–307)

## 2023-09-09 NOTE — Telephone Encounter (Signed)
-----   Message from Lonna Cobb sent at 09/09/2023 11:09 AM EDT ----- Let her know CBC and ferritin look good.  Follow-up as scheduled.

## 2023-09-09 NOTE — Telephone Encounter (Signed)
 Notified of normal CBC and ferritin. F/U as scheduled.

## 2023-12-10 ENCOUNTER — Ambulatory Visit: Payer: 59 | Admitting: Oncology

## 2023-12-10 ENCOUNTER — Other Ambulatory Visit: Payer: 59

## 2023-12-18 ENCOUNTER — Ambulatory Visit: Payer: Self-pay | Admitting: Oncology

## 2023-12-18 ENCOUNTER — Inpatient Hospital Stay: Admitting: Oncology

## 2023-12-18 ENCOUNTER — Encounter: Payer: Self-pay | Admitting: Oncology

## 2023-12-18 ENCOUNTER — Inpatient Hospital Stay: Attending: Oncology

## 2023-12-18 VITALS — BP 114/73 | HR 80 | Temp 97.8°F | Resp 18 | Ht 61.0 in | Wt 141.4 lb

## 2023-12-18 DIAGNOSIS — K5730 Diverticulosis of large intestine without perforation or abscess without bleeding: Secondary | ICD-10-CM | POA: Diagnosis not present

## 2023-12-18 DIAGNOSIS — D509 Iron deficiency anemia, unspecified: Secondary | ICD-10-CM | POA: Diagnosis not present

## 2023-12-18 DIAGNOSIS — Z9884 Bariatric surgery status: Secondary | ICD-10-CM | POA: Insufficient documentation

## 2023-12-18 LAB — CBC WITH DIFFERENTIAL (CANCER CENTER ONLY)
Abs Immature Granulocytes: 0.01 10*3/uL (ref 0.00–0.07)
Basophils Absolute: 0 10*3/uL (ref 0.0–0.1)
Basophils Relative: 1 %
Eosinophils Absolute: 0.1 10*3/uL (ref 0.0–0.5)
Eosinophils Relative: 1 %
HCT: 44.3 % (ref 36.0–46.0)
Hemoglobin: 14.7 g/dL (ref 12.0–15.0)
Immature Granulocytes: 0 %
Lymphocytes Relative: 40 %
Lymphs Abs: 2.4 10*3/uL (ref 0.7–4.0)
MCH: 29.1 pg (ref 26.0–34.0)
MCHC: 33.2 g/dL (ref 30.0–36.0)
MCV: 87.7 fL (ref 80.0–100.0)
Monocytes Absolute: 0.4 10*3/uL (ref 0.1–1.0)
Monocytes Relative: 6 %
Neutro Abs: 3.2 10*3/uL (ref 1.7–7.7)
Neutrophils Relative %: 52 %
Platelet Count: 284 10*3/uL (ref 150–400)
RBC: 5.05 MIL/uL (ref 3.87–5.11)
RDW: 12.8 % (ref 11.5–15.5)
WBC Count: 6.1 10*3/uL (ref 4.0–10.5)
nRBC: 0 % (ref 0.0–0.2)

## 2023-12-18 LAB — VITAMIN B12: Vitamin B-12: 1317 pg/mL — ABNORMAL HIGH (ref 180–914)

## 2023-12-18 LAB — FERRITIN: Ferritin: 183 ng/mL (ref 11–307)

## 2023-12-18 NOTE — Progress Notes (Signed)
  [facility] OFFICE PROGRESS NOTE   Diagnosis: History of iron  deficiency  INTERVAL HISTORY:   [name] returns as scheduled.  She feels well.  No bleeding.  She reports intentional weight loss medical therapy.  Good appetite.  She does not feel like her iron  is low.  Objective:  Vital signs in last 24 hours:  Blood pressure 114/73, pulse 80, temperature 97.8 F (36.6 C), temperature source Temporal, resp. rate 18, height 5' 1 (1.549 m), weight 141 lb 6.4 oz (64.1 kg), last menstrual period 01/28/2011, SpO2 100%.  Resp: Lungs clear bilaterally Cardio: Regular rate and rhythm GI: No hepatosplenomegaly, nontender Vascular: No leg edema  Lab Results:  Lab Results  Component Value Date   WBC 6.1 12/18/2023   HGB 14.7 12/18/2023   HCT 44.3 12/18/2023   MCV 87.7 12/18/2023   PLT 284 12/18/2023   NEUTROABS 3.2 12/18/2023    CMP  Lab Results  Component Value Date   NA 141 04/24/2021   K 5.1 04/24/2021   CL 102 04/24/2021   CO2 25 04/24/2021   GLUCOSE 101 (H) 04/24/2021   BUN 10 04/24/2021   CREATININE 0.90 06/14/2021   CALCIUM 9.6 04/24/2021   GFRNONAA >60 02/12/2011   GFRAA >60 02/12/2011    Medications: I have reviewed the patient's current medications.   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  She reports feeling better after receiving IV iron  in 2023.  The hemoglobin is normal.  Will follow-up on the ferritin level from today.  She would like to continue follow-up in the  hematology clinic.  She will return for an office and lab visit in 6 months.  Arley Hof, MD  12/18/2023  8:44 AM

## 2023-12-18 NOTE — Telephone Encounter (Signed)
 Patient gave verbal understanding and had no further questions or concerns

## 2023-12-18 NOTE — Telephone Encounter (Signed)
-----   Message from Arley Hof sent at 12/18/2023  2:57 PM EDT ----- Please call patient, vitamin B12 and ferritin levels are normal, follow-up as scheduled  ----- Message ----- From: Debby Olam POUR, NP Sent: 12/18/2023   2:52 PM EDT To: Arley KATHEE Hof, MD   ----- Message ----- From: Rebecka, Lab In [place] Sent: 12/18/2023   8:15 AM EDT To: Olam POUR Debby, NP

## 2024-01-12 ENCOUNTER — Ambulatory Visit (HOSPITAL_COMMUNITY)
Admission: RE | Admit: 2024-01-12 | Discharge: 2024-01-12 | Disposition: A | Discharge status: Home / Self Care | Source: Ambulatory Visit | Attending: Vascular Surgery | Admitting: Vascular Surgery

## 2024-01-12 ENCOUNTER — Other Ambulatory Visit (HOSPITAL_COMMUNITY): Payer: Self-pay | Admitting: Orthopedic Surgery

## 2024-01-12 DIAGNOSIS — M79662 Pain in left lower leg: Secondary | ICD-10-CM | POA: Diagnosis present

## 2024-01-12 DIAGNOSIS — M7989 Other specified soft tissue disorders: Secondary | ICD-10-CM | POA: Diagnosis present

## 2024-01-13 ENCOUNTER — Encounter (HOSPITAL_COMMUNITY)

## 2024-03-02 ENCOUNTER — Encounter: Payer: Self-pay | Admitting: Nurse Practitioner

## 2024-04-04 ENCOUNTER — Encounter: Payer: Self-pay | Admitting: Nurse Practitioner

## 2024-04-08 ENCOUNTER — Ambulatory Visit: Payer: 59 | Admitting: Urology

## 2024-04-27 ENCOUNTER — Other Ambulatory Visit: Payer: Self-pay | Admitting: Urology

## 2024-04-27 DIAGNOSIS — N3021 Other chronic cystitis with hematuria: Secondary | ICD-10-CM

## 2024-06-07 ENCOUNTER — Encounter: Payer: Self-pay | Admitting: Nurse Practitioner

## 2024-06-13 ENCOUNTER — Ambulatory Visit: Admitting: Urology

## 2024-06-13 VITALS — BP 121/73 | HR 67

## 2024-06-13 DIAGNOSIS — N3021 Other chronic cystitis with hematuria: Secondary | ICD-10-CM | POA: Diagnosis not present

## 2024-06-13 LAB — URINALYSIS, ROUTINE W REFLEX MICROSCOPIC
Glucose, UA: NEGATIVE
Leukocytes,UA: NEGATIVE
Nitrite, UA: NEGATIVE
Protein,UA: NEGATIVE
RBC, UA: NEGATIVE
Specific Gravity, UA: 1.03 (ref 1.005–1.030)
Urobilinogen, Ur: 1 mg/dL (ref 0.2–1.0)
pH, UA: 5.5 (ref 5.0–7.5)

## 2024-06-13 MED ORDER — NITROFURANTOIN MACROCRYSTAL 50 MG PO CAPS: 50.0000 mg | ORAL_CAPSULE | Freq: Every day | ORAL | 3 refills | Status: AC

## 2024-06-13 NOTE — Progress Notes (Signed)
 "  06/13/2024 10:46 AM   [name] 1965-08-30 [id number]  Referring provider: Rosamond Leta NOVAK, MD [address] [place],  [place] [address]  Frequent UTis   HPI: [name] is a 58yo here for followup for frequent UTI. She is on macrodantin  prophylaxis. No UTI since last visit. She has rare urgency and urge incontinence. No other complaints today   PMH: Past Medical History:  Diagnosis Date   Anxiety    Bladder neck stricture    Depression    GERD (gastroesophageal reflux disease)    Headache(784.0)     Surgical History: Past Surgical History:  Procedure Laterality Date   BLADDER NECK RECONSTRUCTION     BLADDER SUSPENSION  02/11/2011   Procedure: TRANSVAGINAL TAPE (TVT) PROCEDURE;  Surgeon: [name];  Location: [facility];  Service: Gynecology;  Laterality: N/A;   btl     COLONOSCOPY     CYSTOSCOPY  02/11/2011   Procedure: CYSTOSCOPY;  Surgeon: [name];  Location: [facility];  Service: Gynecology;  Laterality: N/A;   svd      x 2   TUBAL LIGATION     VAGINAL HYSTERECTOMY  02/11/2011   Procedure: HYSTERECTOMY VAGINAL;  Surgeon: [name];  Location: [facility];  Service: Gynecology;  Laterality: N/A;    Home Medications:  Allergies as of 06/13/2024   No Known Allergies      Medication List        Accurate as of June 13, 2024 10:46 AM. If you have any questions, ask your nurse or doctor.          acetaminophen  325 MG tablet Commonly known as: TYLENOL  Take 650 mg by mouth every 6 (six) hours as needed for mild pain (pain score 1-3).   buPROPion  300 MG 24 hr tablet Commonly known as: WELLBUTRIN  XL bupropion  HCl XL 300 mg 24 hr tablet, extended release  TAKE ONE TABLET BY MOUTH DAILY   celecoxib 200 MG capsule Commonly known as: CELEBREX celecoxib 200 mg capsule  TAKE ONE CAPSULE BY MOUTH EVERY DAY   Cholecalciferol 1.25 MG (50000 UT) capsule cholecalciferol (vitamin D3) 1,250 mcg (50,000 unit) capsule  TAKE ONE CAPSULE BY MOUTH ONCE WEEKLY    co-enzyme Q-10 30 MG capsule Take 30 mg by mouth daily.   estradiol 0.5 MG tablet Commonly known as: ESTRACE estradiol 0.5 mg tablet  TAKE ONE TABLET BY MOUTH EVERY DAY   nitrofurantoin  50 MG capsule Commonly known as: MACRODANTIN  TAKE ONE CAPSULE BY MOUTH AT BEDTIME   omeprazole 40 MG capsule Commonly known as: PRILOSEC Take 40 mg by mouth daily.   pantoprazole  40 MG tablet Commonly known as: PROTONIX  Take 40 mg by mouth daily.   rosuvastatin 20 MG tablet Commonly known as: CRESTOR Take 20 mg by mouth daily. What changed: additional instructions   semaglutide-weight management 0.5 MG/0.5ML Soaj SQ injection Commonly known as: WEGOVY Inject 0.6 mg into the skin every Tuesday.   tirzepatide 10 MG/0.5ML Pen Commonly known as: MOUNJARO Inject 10 mg into the skin once a week.        Allergies: Allergies[1]  Family History: Family History  Problem Relation Age of Onset   Anesthesia problems Mother     Social History:  reports that she has never smoked. She has never used smokeless tobacco. She reports that she does not drink alcohol and does not use drugs.  ROS: All other review of systems were reviewed and are negative except what is noted above in HPI  Physical Exam: BP 121/73   Pulse 67   LMP 01/27/2011   Constitutional:  Alert and oriented, No acute distress. HEENT: [place] AT, moist mucus membranes.  Trachea midline, no masses. Cardiovascular: No clubbing, cyanosis, or edema. Respiratory: Normal respiratory effort, no increased work of breathing. GI: Abdomen is soft, nontender, nondistended, no abdominal masses GU: No CVA tenderness.  Lymph: No cervical or inguinal lymphadenopathy. Skin: No rashes, bruises or suspicious lesions. Neurologic: Grossly intact, no focal deficits, moving all 4 extremities. Psychiatric: Normal mood and affect.  Laboratory Data: Lab Results  Component Value Date   WBC 6.1 12/18/2023   HGB 14.7 12/18/2023   HCT 44.3  12/18/2023   MCV 87.7 12/18/2023   PLT 284 12/18/2023    Lab Results  Component Value Date   CREATININE 0.90 06/14/2021    No results found for: PSA  No results found for: TESTOSTERONE  No results found for: HGBA1C  Urinalysis    Component Value Date/Time   COLORURINE YELLOW 09/16/2021 1456   APPEARANCEUR Cloudy (A) 04/08/2023 0915   LABSPEC 1.018 09/16/2021 1456   PHURINE 6.0 09/16/2021 1456   GLUCOSEU Negative 04/08/2023 0915   HGBUR NEGATIVE 09/16/2021 1456   BILIRUBINUR Negative 04/08/2023 0915   KETONESUR NEGATIVE 09/16/2021 1456   PROTEINUR Trace 04/08/2023 0915   PROTEINUR NEGATIVE 09/16/2021 1456   UROBILINOGEN 0.2 01/31/2011 1030   NITRITE Negative 04/08/2023 0915   NITRITE NEGATIVE 09/16/2021 1456   LEUKOCYTESUR Negative 04/08/2023 0915   LEUKOCYTESUR NEGATIVE 09/16/2021 1456    Lab Results  Component Value Date   LABMICR Comment 04/08/2023   WBCUA 0-5 04/01/2022   LABEPIT 0-10 04/01/2022   MUCUS Present (A) 04/01/2022   BACTERIA Many (A) 04/01/2022    Pertinent Imaging:  No results found for this or any previous visit.  No results found for this or any previous visit.  No results found for this or any previous visit.  No results found for this or any previous visit.  Results for orders placed during the hospital encounter of 10/25/10  US  Renal  Narrative *RADIOLOGY REPORT*  Clinical Data: Acute urinary retention  RENAL/URINARY TRACT ULTRASOUND COMPLETE  Comparison:  None  Findings:  Right Kidney:  10.6 cm length.  Normal cortical thickness and echogenicity.  No mass, hydronephrosis or shadowing calcification. No perinephric fluid.  Left Kidney:  10.7 cm length. Fetal lobulation of renal cortex, normal variant.  Normal cortical thickness and echogenicity.  No mass, hydronephrosis or shadowing calcification.  No perinephric fluid.  Bladder:  Bladder decompressed by Foley catheter, inadequately evaluated. Incidentally  noted question minimally complicated right ovarian cyst 3.9 x 3.0 x 2.8 cm and probable fatty infiltration of liver.  IMPRESSION: Unremarkable kidneys. Urinary bladder decompressed by Foley catheter. Right ovarian cyst, question minimally complicated. Probable fatty infiltration of liver.  Original Report Authenticated By: ONEIL RONAL KISS, M.D.  No results found for this or any previous visit.  Results for orders placed during the hospital encounter of 06/14/21  CT HEMATURIA WORKUP  Narrative CLINICAL DATA:  Recurrent chronic UTIs x6 months.  Gross hematuria.  EXAM: CT ABDOMEN AND PELVIS WITHOUT AND WITH CONTRAST  TECHNIQUE: Multidetector CT imaging of the abdomen and pelvis was performed following the standard protocol before and following the bolus administration of intravenous contrast.  CONTRAST:  125mL OMNIPAQUE  IOHEXOL  300 MG/ML  SOLN  COMPARISON:  None.  FINDINGS: Lower chest: Bibasilar atelectasis for scarring.  Hepatobiliary: No suspicious hepatic lesion. Gallbladder is unremarkable. No biliary ductal dilation.  Pancreas: No  pancreatic ductal dilation or evidence of acute inflammation.  Spleen: Within normal limits.  Adrenals/Urinary Tract: Bilateral adrenal glands appear normal.  No hydronephrosis. Punctate nonobstructive right interpolar renal calculus. No left-sided nephrolithiasis. No ureteral or bladder calculi identified. Pelvic phleboliths.  Hypodense subcentimeter right interpolar renal lesion on image 35/13 is technically too small to accurately characterize but statistically likely to reflect a cyst. No solid enhancing renal mass.  The bilateral kidneys demonstrate symmetric enhancement and excretion of contrast material. No collecting system duplication. No suspicious filling defect visualized within the opacified portions of the collecting systems or ureters on delayed imaging. However, the distal right ureter is not well opacified limiting  evaluation.  Urinary bladder is unremarkable for degree of distension without asymmetric wall thickening or suspicious intraluminal filling defect identified.  Stomach/Bowel: No enteric contrast was administered. Surgical changes of probable prior gastric sleeve surgery. No pathologic dilation of small or large bowel. The appendix and terminal ileum appear normal. No suspicious colonic wall thickening or mass like lesions identified. Sigmoid colonic diverticulosis without findings of acute diverticulitis.  Vascular/Lymphatic: Aortic atherosclerosis without abdominal aortic aneurysm. No pathologically enlarged abdominal or pelvic lymph nodes.  Reproductive: Status post hysterectomy. No adnexal masses.  Other: No significant abdominopelvic free fluid. No pneumoperitoneum.  Musculoskeletal: L5-S1 discogenic disease. No acute osseous abnormality.  IMPRESSION: 1. Punctate nonobstructive right interpolar renal calculus. No ureteral or bladder calculi identified. No hydronephrosis. 2. No solid enhancing renal mass. 3. Sigmoid colonic diverticulosis without findings of acute diverticulitis. 4.  Aortic Atherosclerosis (ICD10-I70.0).   Electronically Signed By: Reyes Holder M.D. On: 06/14/2021 15:37  No results found for this or any previous visit.   Assessment & Plan:    1. Chronic cystitis with hematuria (Primary) Macrodantin  50mg  qhs - Urinalysis, Routine w reflex microscopic   No follow-ups on file.  Belvie Clara, MD  [facility] [place]       [1] No Known Allergies  "

## 2024-06-20 ENCOUNTER — Other Ambulatory Visit

## 2024-06-20 ENCOUNTER — Inpatient Hospital Stay: Payer: Self-pay | Admitting: Nurse Practitioner

## 2024-06-20 ENCOUNTER — Inpatient Hospital Stay: Payer: Self-pay

## 2024-06-20 ENCOUNTER — Ambulatory Visit: Admitting: Oncology

## 2024-06-22 ENCOUNTER — Telehealth: Payer: Self-pay

## 2024-06-22 ENCOUNTER — Inpatient Hospital Stay: Payer: Self-pay | Admitting: Nurse Practitioner

## 2024-06-22 ENCOUNTER — Inpatient Hospital Stay: Payer: Self-pay

## 2024-06-22 NOTE — Telephone Encounter (Signed)
 Followed up with patient due to missed Hematology appointments with Hematologist and Possible Phlebotomy. Patient reported she had called yesterday to cancer her appointments for today. Reached out to scheduler to reschedule appointment.

## 2024-06-24 ENCOUNTER — Telehealth: Payer: Self-pay | Admitting: Nurse Practitioner

## 2024-06-24 NOTE — Telephone Encounter (Signed)
 Left detailed message on PT voicemail.

## 2024-06-27 ENCOUNTER — Encounter: Payer: Self-pay | Admitting: Urology

## 2024-06-27 NOTE — Patient Instructions (Signed)

## 2024-07-12 ENCOUNTER — Other Ambulatory Visit: Payer: Self-pay | Admitting: Nurse Practitioner

## 2024-07-12 DIAGNOSIS — D509 Iron deficiency anemia, unspecified: Secondary | ICD-10-CM

## 2024-07-13 ENCOUNTER — Encounter: Payer: Self-pay | Admitting: Nurse Practitioner

## 2024-07-14 ENCOUNTER — Encounter: Payer: Self-pay | Admitting: Nurse Practitioner

## 2024-07-20 ENCOUNTER — Inpatient Hospital Stay

## 2024-07-20 ENCOUNTER — Inpatient Hospital Stay: Attending: Nurse Practitioner | Admitting: Nurse Practitioner

## 2024-07-20 ENCOUNTER — Encounter: Payer: Self-pay | Admitting: Nurse Practitioner

## 2024-07-20 VITALS — BP 108/58 | HR 65 | Temp 97.9°F | Resp 18 | Ht 61.0 in | Wt 142.2 lb

## 2024-07-20 DIAGNOSIS — D509 Iron deficiency anemia, unspecified: Secondary | ICD-10-CM | POA: Diagnosis not present

## 2024-07-20 LAB — CBC WITH DIFFERENTIAL (CANCER CENTER ONLY)
Abs Immature Granulocytes: 0 10*3/uL (ref 0.00–0.07)
Basophils Absolute: 0.1 10*3/uL (ref 0.0–0.1)
Basophils Relative: 1 %
Eosinophils Absolute: 0.2 10*3/uL (ref 0.0–0.5)
Eosinophils Relative: 4 %
HCT: 40 % (ref 36.0–46.0)
Hemoglobin: 13.1 g/dL (ref 12.0–15.0)
Immature Granulocytes: 0 %
Lymphocytes Relative: 50 %
Lymphs Abs: 3.5 10*3/uL (ref 0.7–4.0)
MCH: 29.8 pg (ref 26.0–34.0)
MCHC: 32.8 g/dL (ref 30.0–36.0)
MCV: 90.9 fL (ref 80.0–100.0)
Monocytes Absolute: 0.5 10*3/uL (ref 0.1–1.0)
Monocytes Relative: 7 %
Neutro Abs: 2.6 10*3/uL (ref 1.7–7.7)
Neutrophils Relative %: 38 %
Platelet Count: 280 10*3/uL (ref 150–400)
RBC: 4.4 MIL/uL (ref 3.87–5.11)
RDW: 13.3 % (ref 11.5–15.5)
WBC Count: 6.8 10*3/uL (ref 4.0–10.5)
nRBC: 0 % (ref 0.0–0.2)

## 2024-07-20 LAB — FERRITIN: Ferritin: 116 ng/mL (ref 11–307)

## 2024-07-20 NOTE — Progress Notes (Signed)
" °  [facility] OFFICE PROGRESS NOTE   Diagnosis: History of iron  deficiency  INTERVAL HISTORY:   Ms. Malachowski returns for follow-up.  For the past few weeks she reports she has not been sleeping well and is more fatigued.  She does not crave ice.  No tongue soreness.  She denies bleeding.  Objective:  Vital signs in last 24 hours:  Blood pressure (!) 108/58, pulse 65, temperature 97.9 F (36.6 C), temperature source Temporal, resp. rate 18, height 5' 1 (1.549 m), weight 142 lb 3.2 oz (64.5 kg), last menstrual period 01/27/2011, SpO2 100%.    HEENT: No thrush. Resp: Lungs clear bilaterally. Cardio: Regular rate and rhythm. GI: No hepatosplenomegaly. Vascular: No leg edema.   Lab Results:  Lab Results  Component Value Date   WBC 6.8 07/20/2024   HGB 13.1 07/20/2024   HCT 40.0 07/20/2024   MCV 90.9 07/20/2024   PLT 280 07/20/2024   NEUTROABS 2.6 07/20/2024    Imaging:  No results found.  Medications: I have reviewed the patient's current medications.  Assessment/Plan: Iron  deficiency anemia Upper endoscopy 11/14/2021-sleeve gastrectomy with inflammation and erythema, biopsy reactive gastropathy with mild chronic gastritis Colonoscopy 11/14/2021-diverticulosis in the sigmoid colon Venofer  09/27/2021, 10/04/2021 History of thrombocytosis History of elevated lymphocytes History of gastric sleeve surgery 2016 Arthritis Hypercholesterolemia Depression Reflux  Disposition: Ms. Chaudhary appears stable.  She has a history of iron  deficiency anemia.  She last had IV iron  in April 2023.  We reviewed the labs from today.  Hemoglobin/MCV/ferritin stable in normal range.  Plan for continued observation.  She will return for labs and an office visit in 6 months.  We are available to see her sooner if needed.    Olam Ned ANP/GNP-BC   07/20/2024  1:39 PM        "

## 2025-01-17 ENCOUNTER — Inpatient Hospital Stay

## 2025-01-17 ENCOUNTER — Inpatient Hospital Stay: Admitting: Oncology

## 2025-06-05 ENCOUNTER — Ambulatory Visit: Admitting: Urology
